# Patient Record
Sex: Female | Born: 1955 | Race: White | Hispanic: No | Marital: Married | Smoking: Never smoker
Health system: Southern US, Community
[De-identification: ages and names within clinical notes are randomized; demographics above are authoritative.]

## PROBLEM LIST (undated history)

## (undated) DIAGNOSIS — R011 Cardiac murmur, unspecified: Secondary | ICD-10-CM

## (undated) DIAGNOSIS — T7840XA Allergy, unspecified, initial encounter: Secondary | ICD-10-CM

## (undated) DIAGNOSIS — R51 Headache: Secondary | ICD-10-CM

## (undated) DIAGNOSIS — I341 Nonrheumatic mitral (valve) prolapse: Secondary | ICD-10-CM

## (undated) DIAGNOSIS — R519 Headache, unspecified: Secondary | ICD-10-CM

## (undated) DIAGNOSIS — C4491 Basal cell carcinoma of skin, unspecified: Secondary | ICD-10-CM

## (undated) DIAGNOSIS — F419 Anxiety disorder, unspecified: Secondary | ICD-10-CM

## (undated) DIAGNOSIS — J189 Pneumonia, unspecified organism: Secondary | ICD-10-CM

## (undated) DIAGNOSIS — D649 Anemia, unspecified: Secondary | ICD-10-CM

## (undated) DIAGNOSIS — G4733 Obstructive sleep apnea (adult) (pediatric): Secondary | ICD-10-CM

## (undated) DIAGNOSIS — Z9989 Dependence on other enabling machines and devices: Secondary | ICD-10-CM

## (undated) DIAGNOSIS — E785 Hyperlipidemia, unspecified: Secondary | ICD-10-CM

## (undated) DIAGNOSIS — K219 Gastro-esophageal reflux disease without esophagitis: Secondary | ICD-10-CM

## (undated) DIAGNOSIS — J302 Other seasonal allergic rhinitis: Secondary | ICD-10-CM

## (undated) HISTORY — DX: Anxiety disorder, unspecified: F41.9

## (undated) HISTORY — PX: BASAL CELL CARCINOMA EXCISION: SHX1214

## (undated) HISTORY — DX: Cardiac murmur, unspecified: R01.1

## (undated) HISTORY — DX: Hyperlipidemia, unspecified: E78.5

## (undated) HISTORY — DX: Allergy, unspecified, initial encounter: T78.40XA

## (undated) HISTORY — PX: ANTERIOR CERVICAL DECOMP/DISCECTOMY FUSION: SHX1161

## (undated) HISTORY — PX: BACK SURGERY: SHX140

---

## 1995-08-24 HISTORY — PX: ARTHROSCOPIC REPAIR ACL: SUR80

## 1998-11-13 ENCOUNTER — Other Ambulatory Visit: Admission: RE | Admit: 1998-11-13 | Discharge: 1998-11-13 | Payer: Self-pay | Admitting: Gynecology

## 1999-05-06 ENCOUNTER — Other Ambulatory Visit: Admission: RE | Admit: 1999-05-06 | Discharge: 1999-05-06 | Payer: Self-pay | Admitting: Gynecology

## 1999-09-15 ENCOUNTER — Other Ambulatory Visit: Admission: RE | Admit: 1999-09-15 | Discharge: 1999-09-15 | Payer: Self-pay | Admitting: Gynecology

## 2000-05-19 ENCOUNTER — Other Ambulatory Visit: Admission: RE | Admit: 2000-05-19 | Discharge: 2000-05-19 | Payer: Self-pay | Admitting: Gynecology

## 2001-09-12 ENCOUNTER — Other Ambulatory Visit: Admission: RE | Admit: 2001-09-12 | Discharge: 2001-09-12 | Payer: Self-pay | Admitting: Gynecology

## 2003-01-23 ENCOUNTER — Other Ambulatory Visit: Admission: RE | Admit: 2003-01-23 | Discharge: 2003-01-23 | Payer: Self-pay | Admitting: Obstetrics and Gynecology

## 2004-03-18 ENCOUNTER — Emergency Department (HOSPITAL_COMMUNITY): Admission: EM | Admit: 2004-03-18 | Discharge: 2004-03-18 | Payer: Self-pay | Admitting: Emergency Medicine

## 2004-04-03 ENCOUNTER — Other Ambulatory Visit: Admission: RE | Admit: 2004-04-03 | Discharge: 2004-04-03 | Payer: Self-pay | Admitting: Obstetrics and Gynecology

## 2004-06-25 ENCOUNTER — Ambulatory Visit: Payer: Self-pay | Admitting: Family Medicine

## 2004-07-02 ENCOUNTER — Ambulatory Visit: Payer: Self-pay | Admitting: Family Medicine

## 2004-07-15 ENCOUNTER — Ambulatory Visit: Payer: Self-pay | Admitting: Family Medicine

## 2004-10-08 ENCOUNTER — Ambulatory Visit: Payer: Self-pay | Admitting: Family Medicine

## 2004-11-27 ENCOUNTER — Ambulatory Visit: Payer: Self-pay | Admitting: Family Medicine

## 2005-07-01 ENCOUNTER — Ambulatory Visit: Payer: Self-pay | Admitting: Family Medicine

## 2005-07-09 ENCOUNTER — Other Ambulatory Visit: Admission: RE | Admit: 2005-07-09 | Discharge: 2005-07-09 | Payer: Self-pay | Admitting: Family Medicine

## 2005-07-09 ENCOUNTER — Ambulatory Visit: Payer: Self-pay | Admitting: Family Medicine

## 2005-11-19 ENCOUNTER — Ambulatory Visit: Payer: Self-pay | Admitting: Family Medicine

## 2006-07-12 ENCOUNTER — Ambulatory Visit: Payer: Self-pay | Admitting: Family Medicine

## 2006-09-15 ENCOUNTER — Ambulatory Visit: Payer: Self-pay | Admitting: Family Medicine

## 2006-09-15 LAB — CONVERTED CEMR LAB
ALT: 14 units/L (ref 0–40)
AST: 21 units/L (ref 0–37)
HDL: 59.1 mg/dL (ref >39.0)

## 2009-04-17 ENCOUNTER — Encounter: Admission: RE | Admit: 2009-04-17 | Discharge: 2009-04-17 | Payer: Self-pay | Admitting: Internal Medicine

## 2009-04-19 ENCOUNTER — Encounter: Admission: RE | Admit: 2009-04-19 | Discharge: 2009-04-19 | Payer: Self-pay | Admitting: Internal Medicine

## 2009-07-24 ENCOUNTER — Ambulatory Visit (HOSPITAL_BASED_OUTPATIENT_CLINIC_OR_DEPARTMENT_OTHER): Admission: RE | Admit: 2009-07-24 | Discharge: 2009-07-24 | Payer: Self-pay | Admitting: Nurse Practitioner

## 2009-08-02 ENCOUNTER — Ambulatory Visit: Payer: Self-pay | Admitting: Internal Medicine

## 2009-08-18 ENCOUNTER — Ambulatory Visit (HOSPITAL_COMMUNITY): Admission: RE | Admit: 2009-08-18 | Discharge: 2009-08-19 | Payer: Self-pay | Admitting: Neurological Surgery

## 2009-09-23 ENCOUNTER — Encounter: Admission: RE | Admit: 2009-09-23 | Discharge: 2009-09-23 | Payer: Self-pay | Admitting: Neurological Surgery

## 2009-11-14 ENCOUNTER — Encounter: Admission: RE | Admit: 2009-11-14 | Discharge: 2009-11-14 | Payer: Self-pay | Admitting: Neurological Surgery

## 2010-09-04 ENCOUNTER — Encounter
Admission: RE | Admit: 2010-09-04 | Discharge: 2010-09-04 | Payer: Self-pay | Source: Home / Self Care | Attending: Neurological Surgery | Admitting: Neurological Surgery

## 2010-11-23 LAB — DIFFERENTIAL
Basophils Relative: 0 % (ref 0–1)
Lymphs Abs: 1.4 10*3/uL (ref 0.7–4.0)
Monocytes Relative: 6 % (ref 3–12)

## 2010-11-23 LAB — PROTIME-INR
INR: 0.94 (ref 0.00–1.49)
Prothrombin Time: 12.5 seconds (ref 11.6–15.2)

## 2010-11-23 LAB — BASIC METABOLIC PANEL
BUN: 18 mg/dL (ref 6–23)
CO2: 28 mEq/L (ref 19–32)
Chloride: 103 mEq/L (ref 96–112)
Sodium: 137 mEq/L (ref 135–145)

## 2010-11-23 LAB — CBC
HCT: 40.4 % (ref 36.0–46.0)
Hemoglobin: 13.8 g/dL (ref 12.0–15.0)
Platelets: 226 10*3/uL (ref 150–400)
RDW: 13.1 % (ref 11.5–15.5)

## 2010-11-23 LAB — APTT: aPTT: 28 seconds (ref 24–37)

## 2012-09-11 ENCOUNTER — Ambulatory Visit: Payer: Self-pay | Admitting: Internal Medicine

## 2013-09-13 ENCOUNTER — Emergency Department (HOSPITAL_COMMUNITY): Payer: BC Managed Care – PPO

## 2013-09-13 ENCOUNTER — Encounter (HOSPITAL_COMMUNITY): Payer: Self-pay | Admitting: Emergency Medicine

## 2013-09-13 ENCOUNTER — Emergency Department (HOSPITAL_COMMUNITY)
Admission: EM | Admit: 2013-09-13 | Discharge: 2013-09-13 | Disposition: A | Discharge status: Home / Self Care | Payer: BC Managed Care – PPO | Attending: Emergency Medicine | Admitting: Emergency Medicine

## 2013-09-13 ENCOUNTER — Emergency Department (INDEPENDENT_AMBULATORY_CARE_PROVIDER_SITE_OTHER)
Admission: EM | Admit: 2013-09-13 | Discharge: 2013-09-13 | Disposition: A | Discharge status: Hospice | Payer: BC Managed Care – PPO | Source: Home / Self Care | Attending: Emergency Medicine | Admitting: Emergency Medicine

## 2013-09-13 DIAGNOSIS — R51 Headache: Secondary | ICD-10-CM | POA: Insufficient documentation

## 2013-09-13 DIAGNOSIS — R0789 Other chest pain: Secondary | ICD-10-CM | POA: Diagnosis not present

## 2013-09-13 DIAGNOSIS — R5383 Other fatigue: Secondary | ICD-10-CM | POA: Diagnosis not present

## 2013-09-13 DIAGNOSIS — R0602 Shortness of breath: Secondary | ICD-10-CM | POA: Insufficient documentation

## 2013-09-13 DIAGNOSIS — R5381 Other malaise: Secondary | ICD-10-CM | POA: Insufficient documentation

## 2013-09-13 DIAGNOSIS — R209 Unspecified disturbances of skin sensation: Secondary | ICD-10-CM | POA: Insufficient documentation

## 2013-09-13 DIAGNOSIS — R42 Dizziness and giddiness: Secondary | ICD-10-CM | POA: Insufficient documentation

## 2013-09-13 DIAGNOSIS — R079 Chest pain, unspecified: Secondary | ICD-10-CM | POA: Diagnosis present

## 2013-09-13 LAB — BASIC METABOLIC PANEL
BUN: 14 mg/dL (ref 6–23)
CHLORIDE: 105 meq/L (ref 96–112)
CO2: 24 meq/L (ref 19–32)
Calcium: 9 mg/dL (ref 8.4–10.5)
Creatinine, Ser: 0.77 mg/dL (ref 0.50–1.10)
GFR calc Af Amer: >90 mL/min (ref >90)
GLUCOSE: 107 mg/dL — AB (ref 70–99)
POTASSIUM: 4.4 meq/L (ref 3.7–5.3)
Sodium: 141 mEq/L (ref 137–147)

## 2013-09-13 LAB — CBC WITH DIFFERENTIAL/PLATELET
BASOS ABS: 0 10*3/uL (ref 0.0–0.1)
BASOS PCT: 0 % (ref 0–1)
EOS ABS: 0.1 10*3/uL (ref 0.0–0.7)
Eosinophils Relative: 3 % (ref 0–5)
HEMATOCRIT: 39.1 % (ref 36.0–46.0)
HEMOGLOBIN: 13.1 g/dL (ref 12.0–15.0)
LYMPHS ABS: 1.5 10*3/uL (ref 0.7–4.0)
Lymphocytes Relative: 27 % (ref 12–46)
MCH: 30.8 pg (ref 26.0–34.0)
MCHC: 33.5 g/dL (ref 30.0–36.0)
MCV: 91.8 fL (ref 78.0–100.0)
Monocytes Absolute: 0.4 10*3/uL (ref 0.1–1.0)
Monocytes Relative: 6 % (ref 3–12)
Neutro Abs: 3.5 10*3/uL (ref 1.7–7.7)
Neutrophils Relative %: 64 % (ref 43–77)
PLATELETS: 192 10*3/uL (ref 150–400)
RBC: 4.26 MIL/uL (ref 3.87–5.11)
RDW: 13.2 % (ref 11.5–15.5)
WBC: 5.5 10*3/uL (ref 4.0–10.5)

## 2013-09-13 LAB — POCT I-STAT TROPONIN I
Troponin i, poc: 0 ng/mL (ref 0.00–0.08)
Troponin i, poc: 0 ng/mL (ref 0.00–0.08)

## 2013-09-13 MED ORDER — SODIUM CHLORIDE 0.9 % IV SOLN
INTRAVENOUS | Status: DC
  Administered 2013-09-13: 09:00:00 via INTRAVENOUS

## 2013-09-13 MED ORDER — ASPIRIN 81 MG PO CHEW
324.0000 mg | CHEWABLE_TABLET | Freq: Once | ORAL | Status: AC
  Administered 2013-09-13: 324 mg via ORAL

## 2013-09-13 MED ORDER — ASPIRIN 81 MG PO CHEW
CHEWABLE_TABLET | ORAL | Status: AC
  Filled 2013-09-13: qty 4

## 2013-09-13 NOTE — ED Notes (Addendum)
Patient presents to ED via [facility] from urgent care with complaints of chest tightness, shortness of breath,  and heavyness, tingling in both hands, nausea, headache, and left shoulder pain lastnight.

## 2013-09-13 NOTE — Discharge Instructions (Signed)
Continue taking all medications as directed. Follow-up with your primary care physician for re-check and to discuss this ED visit. Return to the ED for new or worsening symptoms.

## 2013-09-13 NOTE — ED Notes (Signed)
Notified [facility] 

## 2013-09-13 NOTE — ED Notes (Signed)
Patient discharged to home with family. NAD.  

## 2013-09-13 NOTE — Discharge Instructions (Signed)
We have determined that your problem requires further evaluation in the emergency department.  We will take care of your transport there.  Once at the emergency department, you will be evaluated by a provider and they will order whatever treatment or tests they deem necessary.  We cannot guarantee that they will do any specific test or do any specific treatment.  ° °

## 2013-09-13 NOTE — ED Notes (Signed)
C/o chest tightness, weak feeling, and lightheadedness States she took oxycodone last night around 10:15 and that's when the sx started States she don't take oxycodone medication on the regular States she did feel jumpy, chest and neck was itchy

## 2013-09-13 NOTE — ED Provider Notes (Signed)
Medical screening examination/treatment/procedure(s) were conducted as a shared visit with non-physician practitioner(s) and myself.  I personally evaluated the patient during the encounter.  EKG Interpretation   None       I interviewed and examined the patient. Lungs are CTAB. Cardiac exam wnl. Abdomen soft.  Atypical for cardiac presentation and pt currently asx. Delta trop neg. Low risk for MACE per HEART score. Will rec close f/u w/ pcp.   Blanchard Kelch, MD 09/13/13 [phone]

## 2013-09-13 NOTE — ED Provider Notes (Signed)
CSN: [id number]     Arrival date & time 09/13/13  9485 History   First MD Initiated Contact with Patient 09/13/13 [phone]     Chief Complaint  Patient presents with  . Chest Pain   (Consider location/radiation/quality/duration/timing/severity/associated sxs/prior Treatment) The history is provided by the patient and medical records.   This is a 64 roll female with past history significant for mitral valve prolapse, presenting to the ED from urgent care for further evaluation of chest pain, onset this morning at 0200. Patient states she was lying awake in bed and began feeling a heavy sensation on her chest associated with paresthesias of bilateral upper extremities, dizziness, generalized weakness, and mild shortness of breath. She admitted to some nausea but denies vomiting. She denies any diaphoresis or palpitations.  Pt states she does recall taking one of her husbands pain pills (oxycodone) prior to going to bed for some left shoulder pain-- states she does not normally do this.  She notes an intermittent, nonproductive cough due to sinus congestion but denies fevers, body aches, sore throat, or ear pain.  Patient does have an extensive family history of heart disease and prior MI.  No recent lower extremity edema, calf pain, recent surgeries, or long distance travel. No prior history of DVT or PE.  No smoking hx.  Pt given ASA at urgent care with relief of sx.  VS stable on arrival. PCP-- Delia Chimes  History reviewed. No pertinent past medical history. History reviewed. No pertinent past surgical history. History reviewed. No pertinent family history. History  Substance Use Topics  . Smoking status: Never Smoker   . Smokeless tobacco: Not on file  . Alcohol Use: Not on file   OB History   Grav Para Term Preterm Abortions TAB SAB Ect Mult Living                 Review of Systems  Respiratory: Positive for shortness of breath.   Cardiovascular: Positive for chest pain.  Neurological:  Positive for headaches.  All other systems reviewed and are negative.    Allergies  Review of patient's allergies indicates no known allergies.  Home Medications  No current outpatient prescriptions on file. BP 129/80  Pulse 64  Temp(Src) 97.7 F (36.5 C) (Oral)  Resp 18  SpO2 100%  Physical Exam  Nursing note and vitals reviewed. Constitutional: She is oriented to person, place, and time. She appears well-developed and well-nourished. No distress.  HENT:  Head: Normocephalic and atraumatic.  Mouth/Throat: Oropharynx is clear and moist.  Eyes: Conjunctivae and EOM are normal. Pupils are equal, round, and reactive to light.  Neck: Normal range of motion. Neck supple.  Cardiovascular: Normal rate, regular rhythm and normal heart sounds.   Pulmonary/Chest: Effort normal and breath sounds normal. No respiratory distress. She has no wheezes.  Abdominal: Soft. Bowel sounds are normal. There is no tenderness. There is no guarding.  Musculoskeletal: Normal range of motion.  Neurological: She is alert and oriented to person, place, and time. She has normal strength. She displays no tremor. No cranial nerve deficit or sensory deficit. She displays no seizure activity.  AAOx3, answering questions appropriately; equal strength UE and LE bilaterally; CN grossly intact; moves all extremities appropriately without ataxia; no focal neuro deficits or facial asymmetry appreciated  Skin: Skin is warm and dry. She is not diaphoretic.  Psychiatric: She has a normal mood and affect.    ED Course  Procedures (including critical care time)   Date: 09/13/2013  Rate: 62  Rhythm: normal sinus rhythm  QRS Axis: normal  Intervals: normal  ST/T Wave abnormalities: normal  Conduction Disutrbances:none  Narrative Interpretation:   Old EKG Reviewed: none available  Labs Review Labs Reviewed  BASIC METABOLIC PANEL - Abnormal; Notable for the following:    Glucose, Bld 107 (*)    All other  components within normal limits  CBC WITH DIFFERENTIAL  POCT I-STAT TROPONIN I  POCT I-STAT TROPONIN I   Imaging Review Dg Chest 2 View  09/13/2013   CLINICAL DATA:  Chest pain  EXAM: CHEST  2 VIEW  COMPARISON:  August 14, 2009  FINDINGS: There is underlying emphysematous change. There is no edema or consolidation. The heart size is normal. Pulmonary vascularity reflects underlying emphysema. No adenopathy. There is postoperative change in the lower cervical spine.  IMPRESSION: Underlying emphysematous change.  No edema or consolidation.   Electronically Signed   By: Lowella Grip M.D.   On: 09/13/2013 10:26    EKG Interpretation   None       MDM   1. Chest pain    EKG normal sinus rhythm, no acute ischemic changes. Troponin negative. Chest x-ray is clear.  Labs as above.  Patient's only known risk factor is positive family history for CAD and MI, heart score is a 3.  Her pain has resolved in the ED, however story is concerning.  Will obtain 3 hour delta troponin.  Delta troponin is negative.  Pt has remained chest pain free while in the ED.  At this time i have low suspicion for ACS, PE, dissection, or other acute cardiac event at this time.  Pt will FU with her PCP within 1 week for re-check and to discuss this ED visit.  Discussed plan with pt and husband at beside, they acknowledged understanding and agrees with plan of care.  Strict return precautions advised for new or worsening symptoms.  Discussed with Dr. Aline Brochure who has personally evaluated pt and agrees with assessment and plan of care.  Larene Pickett, PA-C 09/13/13 1455

## 2013-09-13 NOTE — ED Provider Notes (Signed)
  Chief Complaint   Chief Complaint  Patient presents with  . Dizziness  . Fatigue  . Chest Pain    History of Present Illness    Cheyenne Hanson is a 58 year old female who presents with a history since 2 AM this morning of chest tightness in her upper chest, feeling lightheaded, numb and tingly, and short of breath. She's felt nauseated and slightly cold. She denies any sweats. The patient notes that she had some left shoulder pain last night upon going to bed. She took one of her husbands oxycodone 15 mg tablets at around 10:15. She does not usually take oxycodone. She feels dizzy, lightheaded, and weak. She denies any fever, coughing, wheezing, palpitations, syncope, or abdominal pain. She has had no vomiting. She denies any history of ischemic heart disease. She does have a history of mitral valve prolapse. And she has no history of diabetes, hypertension, or hypercholesterolemia. There is a positive family history of heart disease.  Review of Systems    Other than noted above, the patient denies any of the following symptoms. Systemic:  No fever, chills, sweats, or fatigue. ENT:  No nasal congestion, rhinorrhea, or sore throat. Pulmonary:  No cough, wheezing, shortness of breath, sputum production, hemoptysis. Cardiac:  No palpitations, rapid heartbeat, dizziness, presyncope or syncope. GI:  No abdominal pain, heartburn, nausea, or vomiting. Ext:  No leg pain or swelling.  [place]    Past medical history, family history, social history, meds, and allergies were reviewed and updated as needed.  Physical Exam     Vital signs:  BP 138/77  Pulse 66  Temp(Src) 98 F (36.7 C) (Oral)  Resp 18  SpO2 100% Gen:  Alert, oriented, in no distress, skin warm and dry. Eye:  PERRL, lids and conjunctivas normal.  Sclera non-icteric. ENT:  Mucous membranes moist, pharynx clear. Neck:  Supple, no adenopathy or tenderness.  No JVD. Lungs:  Clear to auscultation, no wheezes, rales or rhonchi.   No respiratory distress. Heart:  Regular rhythm.  No gallops, murmers, clicks or rubs. Chest:  No chest wall tenderness. Abdomen:  Soft, nontender, no organomegaly or mass.  Bowel sounds normal.  No pulsatile abdominal mass or bruit. Ext:  No edema.  No calf tenderness and Homann's sign negative.  Pulses full and equal. Skin:  Warm and dry.  No rash.   Electrocardiogram     Date: 09/13/2013  Rate: 62  Rhythm: normal sinus rhythm  QRS Axis: normal  Intervals: normal  ST/T Wave abnormalities: normal  Conduction Disutrbances:none  Narrative Interpretation: Normal sinus rhythm, normal EKG.  Old EKG Reviewed: none available  Course in [facility]   She was begun on normal saline IV at 50 mL per hour and given aspirin 325 mg by mouth. I did not elect to give her nitroglycerin and she has a migraine type headache right now.  Assessment     The encounter diagnosis was Chest discomfort.  Plan     The patient was transferred to the ED via [place] in stable condition.  Medical Decision Making     58  Year old female with positive FH presents with history of chest tightness, shortness of breath, nausea since 2 a.m. Today.  EKG was normal.  No cardiac history. We will start IV NS, give ASA.  No TNG since she has a migraine type headache.  Transport via [place].    Harden Mo, MD 09/13/13 [phone]

## 2016-12-17 ENCOUNTER — Other Ambulatory Visit: Payer: Self-pay | Admitting: Family Medicine

## 2016-12-17 ENCOUNTER — Ambulatory Visit (INDEPENDENT_AMBULATORY_CARE_PROVIDER_SITE_OTHER): Payer: BLUE CROSS/BLUE SHIELD | Admitting: Family Medicine

## 2016-12-17 ENCOUNTER — Encounter: Payer: Self-pay | Admitting: Family Medicine

## 2016-12-17 VITALS — BP 128/74 | HR 78 | Temp 97.8°F | Resp 14 | Ht 69.0 in | Wt 175.0 lb

## 2016-12-17 DIAGNOSIS — Z1231 Encounter for screening mammogram for malignant neoplasm of breast: Secondary | ICD-10-CM | POA: Diagnosis not present

## 2016-12-17 DIAGNOSIS — Z0000 Encounter for general adult medical examination without abnormal findings: Secondary | ICD-10-CM

## 2016-12-17 DIAGNOSIS — E7800 Pure hypercholesterolemia, unspecified: Secondary | ICD-10-CM | POA: Diagnosis not present

## 2016-12-17 DIAGNOSIS — Z23 Encounter for immunization: Secondary | ICD-10-CM

## 2016-12-17 DIAGNOSIS — G4733 Obstructive sleep apnea (adult) (pediatric): Secondary | ICD-10-CM | POA: Diagnosis not present

## 2016-12-17 DIAGNOSIS — Z1239 Encounter for other screening for malignant neoplasm of breast: Secondary | ICD-10-CM

## 2016-12-17 LAB — COMPLETE METABOLIC PANEL WITH GFR
AG Ratio: 1.7 Ratio (ref 1.0–2.5)
ALBUMIN: 4.3 g/dL (ref 3.6–5.1)
ALT: 18 U/L (ref 6–29)
AST: 19 U/L (ref 10–35)
Alkaline Phosphatase: 42 U/L (ref 33–130)
BUN/Creatinine Ratio: 19 Ratio (ref 6–22)
BUN: 16 mg/dL (ref 7–25)
CO2: 22 mmol/L (ref 20–31)
Calcium: 9.8 mg/dL (ref 8.6–10.4)
Chloride: 106 mmol/L (ref 98–110)
Creat: 0.84 mg/dL (ref 0.50–1.05)
GFR, EST NON AFRICAN AMERICAN: 76 mL/min (ref >=60)
GFR, Est African American: 88 mL/min (ref >=60)
Globulin: 2.6 g/dL (ref 1.9–3.7)
Glucose, Bld: 112 mg/dL — ABNORMAL HIGH (ref 70–99)
Potassium: 4.8 mmol/L (ref 3.5–5.3)
Sodium: 141 mmol/L (ref 135–146)
Total Bilirubin: 0.4 mg/dL (ref 0.2–1.2)
Total Protein: 6.9 g/dL (ref 6.1–8.1)

## 2016-12-17 LAB — CBC WITH DIFFERENTIAL/PLATELET
BASOS ABS: 0 {cells}/uL (ref 0–200)
BASOS PCT: 0 %
EOS ABS: 201 {cells}/uL (ref 15–500)
Eosinophils Relative: 3 %
HEMATOCRIT: 41 % (ref 35.0–45.0)
HEMOGLOBIN: 13.4 g/dL (ref 12.0–15.0)
LYMPHS ABS: 1809 {cells}/uL (ref 850–3900)
Lymphocytes Relative: 27 %
MCH: 30.3 pg (ref 27.0–33.0)
MCHC: 32.7 g/dL (ref 32.0–36.0)
MCV: 92.8 fL (ref 80.0–100.0)
MONO ABS: 402 {cells}/uL (ref 200–950)
MONOS PCT: 6 %
MPV: 9.1 fL (ref 7.5–12.5)
NEUTROS ABS: 4288 {cells}/uL (ref 1500–7800)
Neutrophils Relative %: 64 %
PLATELETS: 258 10*3/uL (ref 140–400)
RBC: 4.42 MIL/uL (ref 3.80–5.10)
RDW: 14 % (ref 11.0–15.0)
WBC: 6.7 10*3/uL (ref 3.8–10.8)

## 2016-12-17 LAB — LIPID PANEL
Cholesterol: 202 mg/dL — ABNORMAL HIGH (ref <200)
HDL: 56 mg/dL (ref >50)
LDL Cholesterol: 123 mg/dL — ABNORMAL HIGH (ref <100)
Total CHOL/HDL Ratio: 3.6 Ratio (ref <5.0)
Triglycerides: 114 mg/dL (ref <150)
VLDL: 23 mg/dL (ref <30)

## 2016-12-17 NOTE — Progress Notes (Signed)
Subjective:    Patient ID: Cheyenne Hanson, female    DOB: 08-01-1956, 61 y.o.   MRN: [id number]  HPI  Patient is a very pleasant 61 year old white female here today to establish care. Past medical history significant for obstructive sleep apnea on CPAP since 2010. At that time showed a sleep study that showed an apnea hypotony index of approximately 16. She is on AutoPap. She also has a history of an ACDF in the cervical spine performed by Dr. Ronnald Ramp in approximately 2010. 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 She is due for the shingles vaccine, tetanus vaccine, a mammogram. Her Pap smear is up-to-date. She sees a gynecologist for this. She does request a new CPAP machine as hers is now almost 60 years old and is wearing out. Past Medical History:  Diagnosis Date  . Allergy   . Anxiety    uses klonopin sparingly (30 lasts a year)  . Hyperlipidemia   . Sleep apnea    cpap since 2008   Past Surgical History:  Procedure Laterality Date  . ARTHROSCOPIC REPAIR ACL    . SPINE SURGERY     fusion cervical spine Dr. Ronnald Ramp   Current Outpatient Prescriptions on File Prior to Visit  Medication Sig Dispense Refill  . ibuprofen (ADVIL,MOTRIN) 200 MG tablet Take 600 mg by mouth every 6 (six) hours as needed for headache or moderate pain.     No current facility-administered medications on file prior to visit.    Allergies  Allergen Reactions  . Latex Other (See Comments)    Blisters if she wears the gloves  . Statins     Muscle aches   Social History   Social History  . Marital status: Married    Spouse name: N/A  . Number of children: N/A  . Years of education: N/A   Occupational History  . Not on file.   Social History Main Topics  . Smoking status: Never Smoker  . Smokeless tobacco: Never Used  . Alcohol use Not on file  . Drug use: Unknown  . Sexual activity: Not on file   Other Topics Concern   . Not on file   Social History Narrative  . No narrative on file   Family History  Problem Relation Age of Onset  . Cancer Father     lung  . Stroke Sister   . Hyperlipidemia Sister   . Heart disease Brother   . Hyperlipidemia Brother    Dad also committed suicide  Review of Systems  All other systems reviewed and are negative.      Objective:   Physical Exam  Constitutional: She is oriented to person, place, and time. She appears well-developed and well-nourished. No distress.  HENT:  Head: Normocephalic and atraumatic.  Right Ear: External ear normal.  Left Ear: External ear normal.  Nose: Nose normal.  Mouth/Throat: Oropharynx is clear and moist. No oropharyngeal exudate.  Eyes: Conjunctivae and EOM are normal. Pupils are equal, round, and reactive to light. Right eye exhibits no discharge. Left eye exhibits no discharge. No scleral icterus.  Neck: Normal range of motion. Neck supple. No JVD present. No tracheal deviation present. No thyromegaly present.  Cardiovascular: Normal rate, regular rhythm, normal heart sounds and intact distal pulses.  Exam reveals no gallop and no friction rub.   No murmur heard. Pulmonary/Chest: Effort normal and breath sounds normal. No stridor. No respiratory distress.  She has no wheezes. She has no rales. She exhibits no tenderness.  Abdominal: Soft. Bowel sounds are normal. She exhibits no distension and no mass. There is no tenderness. There is no rebound and no guarding.  Musculoskeletal: Normal range of motion. She exhibits no edema, tenderness or deformity.  Lymphadenopathy:    She has no cervical adenopathy.  Neurological: She is alert and oriented to person, place, and time. She has normal reflexes. She displays normal reflexes. No cranial nerve deficit. She exhibits normal muscle tone. Coordination normal.  Skin: Skin is warm. No rash noted. She is not diaphoretic. No erythema. No pallor.  Psychiatric: She has a normal mood and  affect. Her behavior is normal. Judgment and thought content normal.  Vitals reviewed.         Assessment & Plan:  General medical exam - Plan: CBC with Differential/Platelet, COMPLETE METABOLIC PANEL WITH GFR, Lipid panel, TSH  Obstructive sleep apnea  Pure hypercholesterolemia  Screening breast examination - Plan: MM Digital Screening  His exam today is completely normal. I will check a CBC, CMP, fasting lipid panel, and TSH. Goal LDL cholesterol is less than 130. I will schedule the patient for mammogram area at her colonoscopy and Pap smear up-to-date. She will receive the tetanus shot today. We discussed the shingles vaccine but she declined the present time. We also discussed HIV and hepatitis C screening but she declined due to lack of risk factors.

## 2016-12-17 NOTE — Addendum Note (Signed)
Addended by: Shary Decamp B on: 12/17/2016 09:33 AM   Modules accepted: Orders

## 2016-12-18 LAB — TSH: TSH: 1.7 m[IU]/L

## 2016-12-20 ENCOUNTER — Encounter: Payer: Self-pay | Admitting: Family Medicine

## 2017-01-11 ENCOUNTER — Ambulatory Visit: Payer: Self-pay

## 2017-01-21 ENCOUNTER — Ambulatory Visit: Payer: Self-pay

## 2017-01-28 ENCOUNTER — Ambulatory Visit
Admission: RE | Admit: 2017-01-28 | Discharge: 2017-01-28 | Disposition: A | Discharge status: Home / Self Care | Payer: BLUE CROSS/BLUE SHIELD | Source: Ambulatory Visit | Attending: Family Medicine | Admitting: Family Medicine

## 2017-01-28 DIAGNOSIS — Z1231 Encounter for screening mammogram for malignant neoplasm of breast: Secondary | ICD-10-CM

## 2017-07-13 ENCOUNTER — Telehealth: Payer: Self-pay | Admitting: Family Medicine

## 2017-07-13 ENCOUNTER — Encounter (HOSPITAL_COMMUNITY): Payer: Self-pay | Admitting: Emergency Medicine

## 2017-07-13 ENCOUNTER — Other Ambulatory Visit: Payer: Self-pay

## 2017-07-13 ENCOUNTER — Ambulatory Visit (HOSPITAL_COMMUNITY)
Admission: EM | Admit: 2017-07-13 | Discharge: 2017-07-13 | Disposition: A | Discharge status: Home / Self Care | Payer: BLUE CROSS/BLUE SHIELD | Attending: Family Medicine | Admitting: Family Medicine

## 2017-07-13 ENCOUNTER — Ambulatory Visit (INDEPENDENT_AMBULATORY_CARE_PROVIDER_SITE_OTHER): Payer: BLUE CROSS/BLUE SHIELD

## 2017-07-13 DIAGNOSIS — J181 Lobar pneumonia, unspecified organism: Secondary | ICD-10-CM | POA: Diagnosis not present

## 2017-07-13 DIAGNOSIS — J189 Pneumonia, unspecified organism: Secondary | ICD-10-CM

## 2017-07-13 MED ORDER — AZITHROMYCIN 250 MG PO TABS: 250.0000 mg | ORAL_TABLET | Freq: Every day | ORAL | 0 refills | Status: DC

## 2017-07-13 MED ORDER — AZELASTINE HCL 0.1 % NA SOLN: 2.0000 | Freq: Two times a day (BID) | NASAL | 0 refills | Status: DC

## 2017-07-13 NOTE — Telephone Encounter (Signed)
Monday started with congestion.  Has on and off fever since then.  Now has occ stabbing pain on left side of chest wall with deep breath.  Has been taking Mucinex for her congestion.  Unsure what could be cause.  Told no appt available here for the rest of today.  Should seek care at an Urgent Care.  She states she will see how she feels in the morning, if not better or worse will seek treatment.

## 2017-07-13 NOTE — ED Provider Notes (Signed)
[facility]    CSN: [id number] Arrival date & time: 07/13/17  1616     History   Chief Complaint Chief Complaint  Patient presents with  . Flank Pain  . Nasal Congestion  . Fever    HPI Cheyenne Hanson is a 61 y.o. female.   61 year old female with history of sleep apnea, HLD, anxiety comes in for 2 day history of congestion, post nasal drip, fevers. Tmax 99.2. Minimal cough that is nonproductive. Also experiencing left rib pain when she takes deep breath with tenderness to palpation. Denies chest pain, shortness of breath, wheezing, weakness, dizziness. Denies abdominal pain, nausea, vomiting. Denies urinary symptoms such as frequency, dysuria, hematuria. Denies personal or family history of blood clots. No long travels, unilateral leg swelling.       Past Medical History:  Diagnosis Date  . Allergy   . Anxiety    uses klonopin sparingly (30 lasts a year)  . Hyperlipidemia   . Sleep apnea    cpap since 2008    There are no active problems to display for this patient.   Past Surgical History:  Procedure Laterality Date  . ARTHROSCOPIC REPAIR ACL    . SPINE SURGERY     fusion cervical spine Dr. Ronnald Ramp    OB History    No data available       Home Medications    Prior to Admission medications   Medication Sig Start Date End Date Taking? Authorizing Provider  clonazePAM (KLONOPIN) 0.5 MG tablet Take 0.5 mg by mouth at bedtime.  11/25/16  Yes [provider]  ezetimibe (ZETIA) 10 MG tablet Take 10 mg by mouth daily.   Yes [provider]  guaiFENesin (MUCINEX) 600 MG 12 hr tablet Take by mouth 2 (two) times daily.   Yes [provider]  ibuprofen (ADVIL,MOTRIN) 200 MG tablet Take 600 mg by mouth every 6 (six) hours as needed for headache or moderate pain.   Yes [provider]  loratadine (CLARITIN) 10 MG tablet Take 10 mg by mouth daily.   Yes [provider]  valACYclovir (VALTREX) 1000 MG tablet Take  1,000 mg by mouth 2 (two) times daily. Prn Fever blisters   Yes [provider]  azelastine (ASTELIN) 0.1 % nasal spray Place 2 sprays into both nostrils 2 (two) times daily. Use in each nostril as directed 07/13/17 10/11/17  Tasia Catchings, Madelline Eshbach V, PA-C  azithromycin (ZITHROMAX) 250 MG tablet Take 1 tablet (250 mg total) by mouth daily. Take first 2 tablets together, then 1 every day until finished. 07/13/17   Ok Edwards, PA-C    Family History Family History  Problem Relation Age of Onset  . Cancer Father        lung  . Stroke Sister   . Hyperlipidemia Sister   . Heart disease Brother   . Hyperlipidemia Brother   . Breast cancer Cousin     Social History Social History   Tobacco Use  . Smoking status: Never Smoker  . Smokeless tobacco: Never Used  Substance Use Topics  . Alcohol use: Not on file  . Drug use: Not on file     Allergies   Latex and Statins   Review of Systems Review of Systems  Reason unable to perform ROS: See HPI as above.     Physical Exam Triage Vital Signs ED Triage Vitals  Enc Vitals Group     BP 07/13/17 1648 (!) 145/75  Pulse Rate 07/13/17 1648 (!) 109     Resp 07/13/17 1648 20     Temp 07/13/17 1648 100.2 F (37.9 C)     Temp src --      SpO2 07/13/17 1648 99 %     Weight --      Height --      Head Circumference --      Peak Flow --      Pain Score 07/13/17 1651 2     Pain Loc --      Pain Edu? --      Excl. in [place]? --    No data found.  Updated Vital Signs BP 135/76 (BP Location: Left Arm)   Pulse (!) 106   Temp 99.9 F (37.7 C) (Oral)   Resp 20   SpO2 99%   Physical Exam  Constitutional: She is oriented to person, place, and time. She appears well-developed and well-nourished. No distress.  HENT:  Head: Normocephalic and atraumatic.  Right Ear: Tympanic membrane, external ear and ear canal normal. Tympanic membrane is not erythematous and not bulging.  Left Ear: Tympanic membrane, external ear and ear canal normal.  Tympanic membrane is not erythematous and not bulging.  Nose: Mucosal edema and rhinorrhea present. Right sinus exhibits no maxillary sinus tenderness and no frontal sinus tenderness. Left sinus exhibits no maxillary sinus tenderness and no frontal sinus tenderness.  Mouth/Throat: Uvula is midline, oropharynx is clear and moist and mucous membranes are normal.  Eyes: Conjunctivae are normal. Pupils are equal, round, and reactive to light.  Neck: Normal range of motion. Neck supple.  Cardiovascular: Normal rate, regular rhythm and normal heart sounds. Exam reveals no gallop and no friction rub.  No murmur heard. Pulmonary/Chest: Effort normal and breath sounds normal. She has no decreased breath sounds. She has no wheezes. She has no rhonchi. She has no rales.  Mild tenderness to palpation of left ribs. No rashes seen.  Lymphadenopathy:    She has no cervical adenopathy.  Neurological: She is alert and oriented to person, place, and time.  Skin: Skin is warm and dry.  Psychiatric: She has a normal mood and affect. Her behavior is normal. Judgment normal.     UC Treatments / Results  Labs (all labs ordered are listed, but only abnormal results are displayed) Labs Reviewed - No data to display  EKG  EKG Interpretation None       Radiology Dg Chest 2 View  Result Date: 07/13/2017 CLINICAL DATA:  Low-grade fever.  Left-sided chest and rib pain. EXAM: CHEST  2 VIEW COMPARISON:  Two-view chest x-ray 09/13/2013 FINDINGS: Superior segment left lower lobe airspace disease is present. The heart size is normal. Lungs are otherwise clear. The visualized soft tissues and bony thorax are otherwise normal. IMPRESSION: 1. Superior segment left lower lobe pneumonia. Electronically Signed   By: San Morelle M.D.   On: 07/13/2017 17:37    Procedures Procedures (including critical care time)  Medications Ordered in UC Medications - No data to display   Initial Impression / Assessment  and Plan / UC Course  I have reviewed the triage vital signs and the nursing notes.  Pertinent labs & imaging results that were available during my care of the patient were reviewed by me and considered in my medical decision making (see chart for details).    CXR with left lower lobe pneumonia. Start azithromycin as directed. Refilled azelastine for nasal congestion. Patient without chest pain, shortness of breath, unilateral  leg swelling, O2 sat 99% without tachypnea, Wells score 1.5. Discussed with patient presents of pneumonia cannot rule out PE, to monitor for now. Strict return precautions given.   Case discussed with Dr Mannie Stabile, who agrees to plan.   Final Clinical Impressions(s) / UC Diagnoses   Final diagnoses:  Pneumonia of left lower lobe due to infectious organism [facility])    ED Discharge Orders        Ordered    azithromycin (ZITHROMAX) 250 MG tablet  Daily     07/13/17 1755    azelastine (ASTELIN) 0.1 % nasal spray  2 times daily     07/13/17 1803       Ok Edwards, PA-C 07/13/17 1839

## 2017-07-13 NOTE — Discharge Instructions (Addendum)
Chest xray with left lower lobe pneumonia. Start azithromycin as directed. Tylenol/motrin for pain and fever. Monitor for any worsening of symptoms, chest pain, shortness of breath, dizziness, weakness, leg swelling, please go to the emergency department for further evaluation.

## 2017-07-13 NOTE — ED Triage Notes (Signed)
Pt states Monday she started having congestion and post nasal drip, pt had fevers at home. Pt also c/o sharp pain in the L rib area when she takes a deep breath. Area is tender.

## 2017-07-17 ENCOUNTER — Encounter: Payer: Self-pay | Admitting: Family Medicine

## 2017-07-18 ENCOUNTER — Ambulatory Visit: Payer: BLUE CROSS/BLUE SHIELD | Admitting: Family Medicine

## 2017-07-18 ENCOUNTER — Encounter: Payer: Self-pay | Admitting: Family Medicine

## 2017-07-18 ENCOUNTER — Other Ambulatory Visit: Payer: Self-pay

## 2017-07-18 VITALS — BP 120/70 | HR 90 | Temp 99.5°F | Resp 16 | Ht 69.0 in | Wt 177.0 lb

## 2017-07-18 DIAGNOSIS — J181 Lobar pneumonia, unspecified organism: Secondary | ICD-10-CM | POA: Diagnosis not present

## 2017-07-18 DIAGNOSIS — J189 Pneumonia, unspecified organism: Secondary | ICD-10-CM

## 2017-07-18 LAB — CBC
HEMATOCRIT: 34.6 % — AB (ref 35.0–45.0)
Hemoglobin: 11.5 g/dL — ABNORMAL LOW (ref 11.7–15.5)
MCH: 31.1 pg (ref 27.0–33.0)
MCHC: 33.2 g/dL (ref 32.0–36.0)
MCV: 93.5 fL (ref 80.0–100.0)
Platelets: 347 10*3/uL (ref 140–400)
RBC: 3.7 10*6/uL — ABNORMAL LOW (ref 3.80–5.10)
RDW: 13.9 % (ref 11.0–15.0)
WBC: 11.3 10*3/uL — ABNORMAL HIGH (ref 3.8–10.8)

## 2017-07-18 LAB — INFLUENZA A AND B AG, IMMUNOASSAY
INFLUENZA A ANTIGEN: NOT DETECTED
INFLUENZA B ANTIGEN: NOT DETECTED

## 2017-07-18 MED ORDER — LEVOFLOXACIN 750 MG PO TABS: 750.0000 mg | ORAL_TABLET | Freq: Every day | ORAL | 0 refills | Status: DC

## 2017-07-18 MED ORDER — GUAIFENESIN-CODEINE 100-10 MG/5ML PO SOLN
5.0000 mL | Freq: Four times a day (QID) | ORAL | 0 refills | Status: DC | PRN
Start: 2017-07-18 — End: 2017-08-12

## 2017-07-18 NOTE — Progress Notes (Signed)
   Subjective:    Patient ID: Cheyenne Hanson, female    DOB: [birth year], 61 y.o.   MRN: [id number]  Patient presents for UC F/U (dx: PNA- fever (T max 101.5 while on meds), HA, body aches, nasal congestion)  Patient here to follow-up her pneumonia.  She was seen at the urgent care after she had had progressive symptoms over about 3 days where she initially had a headache but then started having some cough low-grade fever left side pain.  Chest x-ray was obtained which showed left lower lobe pneumonia.  She was started on azithromycin.  2 days later she began to have higher temps T-max around 101.5 while taking Tylenol and ibuprofen.  She still gets the headaches but does have history of underlying migraines.  She is also had some body aches and congestion.  Flu test was not done.  She completed her azithromycin yesterday.  She now has more cough which has some mild production.  She denies any vomiting or diarrhea.  She did explain a sensation when her fever goes up she feels very warm on the bottom of her feet.   Review Of Systems:  GEN- + fatigue, fever, weight loss,weakness, recent illness HEENT- denies eye drainage, change in vision, nasal discharge, CVS- denies chest pain, palpitations RESP- denies SOB, +cough, wheeze ABD- denies N/V, change in stools, abd pain GU- denies dysuria, hematuria, dribbling, incontinence MSK- denies joint pain, muscle aches, injury Neuro- +s headache,+ dizziness, syncope, seizure activity       Objective:    BP 120/70   Pulse 90   Temp 99.5 F (37.5 C) (Oral)   Resp 16   Ht 5\' 9"  (1.753 m)   Wt 177 lb (80.3 kg)   SpO2 94%   BMI 26.14 kg/m  GEN- NAD, alert and oriented x3 HEENT- PERRL, EOMI, non injected sclera, pink conjunctiva, MMM, oropharynx clear, nares clear rhinorrhea,no maxillary sinus tenderness Neck- Supple, no LAD  CVS- RRR, no murmur RESP- mild rhonchi left lower lobe, no wheeze, normal WOB ABD-NABS,soft,NT,ND Neuro-CNII-XII in tact,  no papilledema, no focal deficits  EXT- No edema Pulses- Radial, DP- 2+        Assessment & Plan:      Problem List Items Addressed This Visit    None    Visit Diagnoses    Community acquired pneumonia of left lower lobe of lung (HCC)    -  Primary   Influenza Neg, WBC elevated at  11.5, persistant fever despite azithromycin. Minimal sputum production at this time. Change antibiotics to Levaquin 750mg  daily. Robitussin codiene She will need repeat CXR in 3-4 weeks. If for some reason she still has high fevers and symptoms despite the antibiotic change on Friday, she will need CT of chest.    Relevant Medications   levofloxacin (LEVAQUIN) 750 MG tablet   guaiFENesin-codeine 100-10 MG/5ML syrup   Other Relevant Orders   Influenza A and B Ag, Immunoassay (Completed)   CBC (Completed)   Comprehensive metabolic panel      Note: This dictation was prepared with Dragon dictation along with smaller phrase technology. Any transcriptional errors that result from this process are unintentional.

## 2017-07-18 NOTE — Patient Instructions (Addendum)
Take Levaquin as prescribed Flu test negative  Continue alternating the tylenol/motrin for fever Call me Friday

## 2017-07-19 ENCOUNTER — Other Ambulatory Visit: Payer: Self-pay | Admitting: *Deleted

## 2017-07-19 DIAGNOSIS — R7989 Other specified abnormal findings of blood chemistry: Secondary | ICD-10-CM

## 2017-07-19 DIAGNOSIS — R945 Abnormal results of liver function studies: Principal | ICD-10-CM

## 2017-07-19 LAB — COMPREHENSIVE METABOLIC PANEL
AG Ratio: 1.1 (calc) (ref 1.0–2.5)
ALT: 48 U/L — AB (ref 6–29)
AST: 31 U/L (ref 10–35)
Albumin: 3.2 g/dL — ABNORMAL LOW (ref 3.6–5.1)
Alkaline phosphatase (APISO): 84 U/L (ref 33–130)
BUN: 9 mg/dL (ref 7–25)
CO2: 21 mmol/L (ref 20–32)
CREATININE: 0.7 mg/dL (ref 0.50–0.99)
Calcium: 8.3 mg/dL — ABNORMAL LOW (ref 8.6–10.4)
Chloride: 105 mmol/L (ref 98–110)
GLOBULIN: 2.8 g/dL (ref 1.9–3.7)
GLUCOSE: 100 mg/dL — AB (ref 65–99)
Potassium: 3.6 mmol/L (ref 3.5–5.3)
SODIUM: 141 mmol/L (ref 135–146)
Total Bilirubin: 0.4 mg/dL (ref 0.2–1.2)
Total Protein: 6 g/dL — ABNORMAL LOW (ref 6.1–8.1)

## 2017-07-20 ENCOUNTER — Encounter (HOSPITAL_COMMUNITY): Payer: Self-pay | Admitting: Emergency Medicine

## 2017-07-20 ENCOUNTER — Ambulatory Visit
Admission: RE | Admit: 2017-07-20 | Discharge: 2017-07-20 | Disposition: A | Discharge status: Home / Self Care | Payer: BLUE CROSS/BLUE SHIELD | Source: Ambulatory Visit | Attending: Family Medicine | Admitting: Family Medicine

## 2017-07-20 ENCOUNTER — Telehealth: Payer: Self-pay | Admitting: Family Medicine

## 2017-07-20 ENCOUNTER — Inpatient Hospital Stay (HOSPITAL_COMMUNITY)
Admission: EM | Admit: 2017-07-20 | Discharge: 2017-07-22 | DRG: 195 | Disposition: A | Discharge status: Home / Self Care | Payer: BLUE CROSS/BLUE SHIELD | Attending: Internal Medicine | Admitting: Internal Medicine

## 2017-07-20 DIAGNOSIS — Z8349 Family history of other endocrine, nutritional and metabolic diseases: Secondary | ICD-10-CM

## 2017-07-20 DIAGNOSIS — Z823 Family history of stroke: Secondary | ICD-10-CM

## 2017-07-20 DIAGNOSIS — J189 Pneumonia, unspecified organism: Secondary | ICD-10-CM

## 2017-07-20 DIAGNOSIS — Z8249 Family history of ischemic heart disease and other diseases of the circulatory system: Secondary | ICD-10-CM

## 2017-07-20 DIAGNOSIS — J181 Lobar pneumonia, unspecified organism: Principal | ICD-10-CM

## 2017-07-20 DIAGNOSIS — D649 Anemia, unspecified: Secondary | ICD-10-CM | POA: Diagnosis present

## 2017-07-20 DIAGNOSIS — F419 Anxiety disorder, unspecified: Secondary | ICD-10-CM | POA: Diagnosis present

## 2017-07-20 DIAGNOSIS — Z888 Allergy status to other drugs, medicaments and biological substances status: Secondary | ICD-10-CM

## 2017-07-20 DIAGNOSIS — Z803 Family history of malignant neoplasm of breast: Secondary | ICD-10-CM

## 2017-07-20 DIAGNOSIS — Z23 Encounter for immunization: Secondary | ICD-10-CM

## 2017-07-20 DIAGNOSIS — G4733 Obstructive sleep apnea (adult) (pediatric): Secondary | ICD-10-CM | POA: Diagnosis present

## 2017-07-20 DIAGNOSIS — R509 Fever, unspecified: Secondary | ICD-10-CM | POA: Diagnosis not present

## 2017-07-20 DIAGNOSIS — E785 Hyperlipidemia, unspecified: Secondary | ICD-10-CM | POA: Diagnosis present

## 2017-07-20 DIAGNOSIS — Z91040 Latex allergy status: Secondary | ICD-10-CM

## 2017-07-20 HISTORY — DX: Headache: R51

## 2017-07-20 HISTORY — DX: Obstructive sleep apnea (adult) (pediatric): G47.33

## 2017-07-20 HISTORY — DX: Gastro-esophageal reflux disease without esophagitis: K21.9

## 2017-07-20 HISTORY — DX: Dependence on other enabling machines and devices: Z99.89

## 2017-07-20 HISTORY — DX: Nonrheumatic mitral (valve) prolapse: I34.1

## 2017-07-20 HISTORY — DX: Other seasonal allergic rhinitis: J30.2

## 2017-07-20 HISTORY — DX: Pneumonia, unspecified organism: J18.9

## 2017-07-20 HISTORY — DX: Headache, unspecified: R51.9

## 2017-07-20 HISTORY — DX: Basal cell carcinoma of skin, unspecified: C44.91

## 2017-07-20 HISTORY — DX: Anemia, unspecified: D64.9

## 2017-07-20 LAB — COMPREHENSIVE METABOLIC PANEL
ALT: 61 U/L — ABNORMAL HIGH (ref 14–54)
AST: 47 U/L — ABNORMAL HIGH (ref 15–41)
Albumin: 2.6 g/dL — ABNORMAL LOW (ref 3.5–5.0)
Alkaline Phosphatase: 86 U/L (ref 38–126)
Anion gap: 10 (ref 5–15)
BUN: 15 mg/dL (ref 6–20)
CHLORIDE: 104 mmol/L (ref 101–111)
CO2: 23 mmol/L (ref 22–32)
Calcium: 8.7 mg/dL — ABNORMAL LOW (ref 8.9–10.3)
Creatinine, Ser: 0.83 mg/dL (ref 0.44–1.00)
GFR calc Af Amer: >60 mL/min (ref >60)
GFR calc non Af Amer: >60 mL/min (ref >60)
Glucose, Bld: 107 mg/dL — ABNORMAL HIGH (ref 65–99)
POTASSIUM: 3.5 mmol/L (ref 3.5–5.1)
Sodium: 137 mmol/L (ref 135–145)
TOTAL PROTEIN: 6.6 g/dL (ref 6.5–8.1)
Total Bilirubin: 0.1 mg/dL — ABNORMAL LOW (ref 0.3–1.2)

## 2017-07-20 LAB — CBC WITH DIFFERENTIAL/PLATELET
Basophils Absolute: 0.1 10*3/uL (ref 0.0–0.1)
Basophils Relative: 1 %
Eosinophils Absolute: 0.3 10*3/uL (ref 0.0–0.7)
Eosinophils Relative: 3 %
HCT: 35.5 % — ABNORMAL LOW (ref 36.0–46.0)
Hemoglobin: 11.6 g/dL — ABNORMAL LOW (ref 12.0–15.0)
LYMPHS ABS: 1.6 10*3/uL (ref 0.7–4.0)
LYMPHS PCT: 17 %
MCH: 29.6 pg (ref 26.0–34.0)
MCHC: 32.7 g/dL (ref 30.0–36.0)
MCV: 90.6 fL (ref 78.0–100.0)
MONO ABS: 0.6 10*3/uL (ref 0.1–1.0)
Monocytes Relative: 7 %
Neutro Abs: 6.9 10*3/uL (ref 1.7–7.7)
Neutrophils Relative %: 72 %
PLATELETS: 428 10*3/uL — AB (ref 150–400)
RBC: 3.92 MIL/uL (ref 3.87–5.11)
RDW: 13.6 % (ref 11.5–15.5)
WBC: 9.5 10*3/uL (ref 4.0–10.5)

## 2017-07-20 LAB — I-STAT CG4 LACTIC ACID, ED: Lactic Acid, Venous: 0.92 mmol/L (ref 0.5–1.9)

## 2017-07-20 MED ORDER — DEXTROSE 5 % IV SOLN
1.0000 g | Freq: Once | INTRAVENOUS | Status: AC
  Administered 2017-07-21: 1 g via INTRAVENOUS
  Filled 2017-07-20: qty 1

## 2017-07-20 MED ORDER — VANCOMYCIN HCL 10 G IV SOLR
1500.0000 mg | Freq: Once | INTRAVENOUS | Status: AC
  Administered 2017-07-21: 1500 mg via INTRAVENOUS
  Filled 2017-07-20: qty 1500

## 2017-07-20 MED ORDER — SODIUM CHLORIDE 0.9 % IV BOLUS (SEPSIS)
1000.0000 mL | Freq: Once | INTRAVENOUS | Status: AC
  Administered 2017-07-20: 1000 mL via INTRAVENOUS

## 2017-07-20 NOTE — Telephone Encounter (Signed)
Send her for Urgent CT of chest, Fever, CAP

## 2017-07-20 NOTE — Progress Notes (Signed)
Pharmacy Antibiotic Note  Cheyenne Hanson is a 61 y.o. female admitted on 07/20/2017 with pneumonia.  Pharmacy has been consulted for vancomycin dosing. Cefepime and Azithromycin per MD.   WBC within normal limits and afebrile. SCr 0.83 for estimated CrCl ~ 80 mL/min.   Patient received cefepime 1g IV x1 in the ED.   Plan: Vancomycin 1.5g IV x1, then 1g IV q12hr  Cefepime 1g IV q8hr per MD - dose appropriate  Azithromycin 500 mg IV q24hr - dose appropriate  Vancomycin trough at steady state and PRN (goal 15-53mcg/mL) Monitor renal function, clinical picture, and culture data F/u length of therapy and de-escalation   Temp (24hrs), Avg:98.7 F (37.1 C), Min:98.7 F (37.1 C), Max:98.7 F (37.1 C)  Recent Labs  Lab 07/18/17 1538 07/20/17 2132  WBC 11.3* 9.5  CREATININE 0.70 0.83    Estimated Creatinine Clearance: 81.7 mL/min (by C-G formula based on SCr of 0.83 mg/dL).    Allergies  Allergen Reactions  . Latex Other (See Comments)    Blisters if she wears the gloves  . Statins     Muscle aches   Antimicrobials this admission: 11/29 Vanc >>  11/29 Cefepime >> 11/29 Azith >>   Microbiology results: pending   Lavonda Jumbo, PharmD Clinical Pharmacist 07/20/17 11:55 PM

## 2017-07-20 NOTE — Telephone Encounter (Signed)
Husband calling, concerned wife is not getting better.  Still running low grade fever 99.7.  Headache gone but is still very weak.  Denies any respiratory distress.  Please advise?

## 2017-07-20 NOTE — ED Provider Notes (Signed)
[facility] EMERGENCY DEPARTMENT Provider Note   CSN: [id number] Arrival date & time: 07/20/17  2024     History   Chief Complaint Chief Complaint  Patient presents with  . Pneumonia    HPI Cheyenne Hanson is a 61 y.o. female.  Patient sent by PCP for persistent left-sided pneumonia.  States has been sick since November 20 with cough, left-sided pleuritic chest pain, headache and fevers.  She was diagnosed with left-sided pneumonia by x-ray and prescribed Zithromax which she completed on November 25 she saw her PCP the next day and was prescribed Levaquin for which she has been on for 3 days.  Had outpatient CT scan today that showed persistent left-sided infiltrate with pleural effusion.. She continues to feel body aches, headache, left-sided pleuritic chest pain.  States cough is minimal and nonproductive.  No leg pain or leg swelling.  No history of heart or lung problems.  No recent travel or sick contacts.  No vomiting or diarrhea. Good PO intake and urine output.   The history is provided by the patient and a relative.  Pneumonia  Associated symptoms include headaches and shortness of breath. Pertinent negatives include no chest pain and no abdominal pain.    Past Medical History:  Diagnosis Date  . Allergy   . Anxiety    uses klonopin sparingly (30 lasts a year)  . Hyperlipidemia   . Sleep apnea    cpap since 2008    There are no active problems to display for this patient.   Past Surgical History:  Procedure Laterality Date  . ARTHROSCOPIC REPAIR ACL    . SPINE SURGERY     fusion cervical spine Dr. Ronnald Ramp    OB History    No data available       Home Medications    Prior to Admission medications   Medication Sig Start Date End Date Taking? Authorizing Provider  azelastine (ASTELIN) 0.1 % nasal spray Place 2 sprays into both nostrils 2 (two) times daily. Use in each nostril as directed 07/13/17 10/11/17  Ok Edwards, PA-C  clonazePAM  (KLONOPIN) 0.5 MG tablet Take 0.5 mg by mouth at bedtime.  11/25/16   [provider]  ezetimibe (ZETIA) 10 MG tablet Take 10 mg by mouth daily.    [provider]  guaiFENesin (MUCINEX) 600 MG 12 hr tablet Take by mouth 2 (two) times daily.    [provider]  guaiFENesin-codeine 100-10 MG/5ML syrup Take 5 mLs by mouth every 6 (six) hours as needed. 07/18/17   [place], Modena Nunnery, MD  ibuprofen (ADVIL,MOTRIN) 200 MG tablet Take 600 mg by mouth every 6 (six) hours as needed for headache or moderate pain.    [provider]  levofloxacin (LEVAQUIN) 750 MG tablet Take 1 tablet (750 mg total) by mouth daily. 07/18/17   Alycia Rossetti, MD  loratadine (CLARITIN) 10 MG tablet Take 10 mg by mouth daily.    [provider]  valACYclovir (VALTREX) 1000 MG tablet Take 1,000 mg by mouth 2 (two) times daily. Prn Fever blisters    [provider]    Family History Family History  Problem Relation Age of Onset  . Cancer Father        lung  . Stroke Sister   . Hyperlipidemia Sister   . Heart disease Brother   . Hyperlipidemia Brother   . Breast cancer Cousin     Social History Social History   Tobacco Use  .  Smoking status: Never Smoker  . Smokeless tobacco: Never Used  Substance Use Topics  . Alcohol use: No    Frequency: Never  . Drug use: No     Allergies   Latex and Statins   Review of Systems Review of Systems  Constitutional: Positive for activity change, appetite change and fever.  HENT: Positive for congestion. Negative for postnasal drip and sinus pressure.   Eyes: Negative for visual disturbance.  Respiratory: Positive for cough and shortness of breath.   Cardiovascular: Negative for chest pain and leg swelling.  Gastrointestinal: Negative for abdominal pain, nausea and vomiting.  Genitourinary: Negative for dysuria, hematuria, vaginal bleeding and vaginal discharge.  Musculoskeletal: Positive for arthralgias and  myalgias.  Skin: Negative for rash.  Neurological: Positive for headaches. Negative for dizziness and weakness.   all other systems are negative except as noted in the HPI and PMH.     Physical Exam Updated Vital Signs BP 139/86 (BP Location: Right Arm)   Pulse 86   Temp 98.7 F (37.1 C) (Oral)   Resp 18   SpO2 98%   Physical Exam  Constitutional: She is oriented to person, place, and time. She appears well-developed and well-nourished. No distress.  HENT:  Head: Normocephalic and atraumatic.  Mouth/Throat: Oropharynx is clear and moist. No oropharyngeal exudate.  Eyes: Conjunctivae and EOM are normal. Pupils are equal, round, and reactive to light.  Neck: Normal range of motion. Neck supple.  No meningismus.  Cardiovascular: Normal rate, regular rhythm, normal heart sounds and intact distal pulses.  No murmur heard. Pulmonary/Chest: Effort normal and breath sounds normal. No respiratory distress. She exhibits no tenderness.  L sided rhonchi  Abdominal: Soft. There is no tenderness. There is no rebound and no guarding.  Musculoskeletal: Normal range of motion. She exhibits no edema or tenderness.  Neurological: She is alert and oriented to person, place, and time. No cranial nerve deficit. She exhibits normal muscle tone. Coordination normal.  No ataxia on finger to nose bilaterally. No pronator drift. 5/5 strength throughout. CN 2-12 intact.Equal grip strength. Sensation intact.   Skin: Skin is warm. Capillary refill takes less than 2 seconds.  Psychiatric: She has a normal mood and affect. Her behavior is normal.  Nursing note and vitals reviewed.    ED Treatments / Results  Labs (all labs ordered are listed, but only abnormal results are displayed) Labs Reviewed  CBC WITH DIFFERENTIAL/PLATELET - Abnormal; Notable for the following components:      Result Value   Hemoglobin 11.6 (*)    HCT 35.5 (*)    Platelets 428 (*)    All other components within normal limits    COMPREHENSIVE METABOLIC PANEL - Abnormal; Notable for the following components:   Glucose, Bld 107 (*)    Calcium 8.7 (*)    Albumin 2.6 (*)    AST 47 (*)    ALT 61 (*)    Total Bilirubin 0.1 (*)    All other components within normal limits  D-DIMER, QUANTITATIVE (NOT AT [facility]) - Abnormal; Notable for the following components:   D-Dimer, Quant 3.06 (*)    All other components within normal limits  CULTURE, BLOOD (ROUTINE X 2)  CULTURE, BLOOD (ROUTINE X 2)  INFLUENZA PANEL BY PCR (TYPE A & B)  TROPONIN I  BRAIN NATRIURETIC PEPTIDE  I-STAT CG4 LACTIC ACID, ED  I-STAT CG4 LACTIC ACID, ED    EKG  EKG Interpretation  Date/Time:  Thursday July 21 2017 01:14:11 EST Ventricular Rate:  83 PR Interval:    QRS Duration: 72 QT Interval:  384 QTC Calculation: 452 R Axis:   78 Text Interpretation:  Sinus rhythm RSR' in V1 or V2, probably normal variant No significant change was found Confirmed by Ezequiel Essex [phone]) on 07/21/2017 1:35:20 AM       Radiology Ct Chest Wo Contrast  Result Date: 07/20/2017 CLINICAL DATA:  61 y/o F; left chest pain, cough and fever with pneumonia. EXAM: CT CHEST WITHOUT CONTRAST TECHNIQUE: Multidetector CT imaging of the chest was performed following the standard protocol without IV contrast. COMPARISON:  07/13/2017 chest radiograph FINDINGS: Cardiovascular: No significant vascular findings. Normal heart size. No pericardial effusion. Mediastinum/Nodes: Left lower paratracheal lymphadenopathy, likely reactive. Normal thyroid gland. Normal thoracic esophagus. Lungs/Pleura: Left lower lobe consolidation and small left effusion are increased from prior radiograph. Biapical pleuroparenchymal scarring. Upper Abdomen: Right lobe of liver 24 mm cyst and subcentimeter lucency in the dome a right liver, too small to characterize, but likely additional cysts. Musculoskeletal: No chest wall mass or suspicious bone lesions identified. IMPRESSION: Left lower lobe  pneumonia and small left pleural effusion increased from prior radiographs. Electronically Signed   By: Kristine Garbe M.D.   On: 07/20/2017 14:47   Ct Angio Chest Pe W And/or Wo Contrast  Result Date: 07/21/2017 CLINICAL DATA:  Left-sided chest tightness.  Cough and headache. EXAM: CT ANGIOGRAPHY CHEST WITH CONTRAST TECHNIQUE: Multidetector CT imaging of the chest was performed using the standard protocol during bolus administration of intravenous contrast. Multiplanar CT image reconstructions and MIPs were obtained to evaluate the vascular anatomy. CONTRAST:  35mL ISOVUE-370 IOPAMIDOL (ISOVUE-370) INJECTION 76% COMPARISON:  Noncontrast chest CT performed yesterday. FINDINGS: Cardiovascular: There are no filling defects within the pulmonary arteries to suggest pulmonary embolus. Normal caliber thoracic aorta. Heart is normal in size. No pericardial effusion. Mediastinum/Nodes: Prominent left hilar node measures 10 mm. Enlarged left paratracheal node measures 12 mm. No right hilar adenopathy. The esophagus is decompressed. Visualized thyroid gland is normal. Lungs/Pleura: Ground-glass and consolidative opacities within the left lower lobe as seen on CT yesterday. Small-moderate left pleural effusion slightly increased, adjacent atelectasis in the dependent left upper lobe. Again seen biapical pleuroparenchymal scarring. No pulmonary edema. No right pleural effusion. Fissural thickening of the minor fissure on the right likely an intrapulmonary lymph node. Upper Abdomen: Subcapsular 2.4 cm hypodense lesion in the right lobe of the liver, likely cyst. No acute finding. Musculoskeletal: There are no acute or suspicious osseous abnormalities. Review of the MIP images confirms the above findings. IMPRESSION: 1. No pulmonary embolus. 2. Ground-glass consolidative opacity in the left lower lobe likely pneumonia, unchanged in CT yesterday. Small to moderate left pleural effusion has slightly increased in the  interim, with adjacent compressive atelectasis in the dependent left upper lobe. Recommend radiographic follow-up to resolution after course of treatment in 3-4 weeks. Electronically Signed   By: Jeb Levering M.D.   On: 07/21/2017 01:55    Procedures Procedures (including critical care time)  Medications Ordered in ED Medications - No data to display   Initial Impression / Assessment and Plan / ED Course  I have reviewed the triage vital signs and the nursing notes.  Pertinent labs & imaging results that were available during my care of the patient were reviewed by me and considered in my medical decision making (see chart for details).    Patient with 8 days of cough, shortness of breath fever and pleuritic chest pain.  Sent to the ED with persistent  pneumonia on CT scan.  Patient is in no distress.  No hypoxia.  Vitals are stable. CT scan from earlier today reviewed that shows left sided pneumonia with pleural effusion.  This was done without contrast however.  Persistent pleuritic chest pain will obtain d-dimer as pneumonia is not improving despite adequate antibiotic treatment.  CT shows no pulmonary embolism but does show persistent left-sided pneumonia with pleural effusion.  IV antibiotics started.  Patient nontoxic with stable vitals.  She is not hypoxic.  Patient does not seem to be improving despite 2 courses of outpatient antibiotics.  This appears why she was sent in by her PCP.  We will plan observation for IV antibiotics.  Discussed with Dr. Hal Hope.  Final Clinical Impressions(s) / ED Diagnoses   Final diagnoses:  Community acquired pneumonia of left lower lobe of lung [facility])    ED Discharge Orders    None       Roy Snuffer, Annie Main, MD 07/21/17 [phone]

## 2017-07-20 NOTE — ED Triage Notes (Signed)
Patient advised by her PCP to go to ER - CT scan result taken at [facility] today shows worsening Pneumonia , currently taking Levaquin antibiotic with no improvement , respirations unlabored / occasional dry cough with left lateral ribcage pain with deep inspirations .

## 2017-07-20 NOTE — Telephone Encounter (Signed)
Spoke to husband, to go to [facility] now for CT Scan, no pre auth needed.  Will call them after results with provider recommendations

## 2017-07-21 ENCOUNTER — Encounter (HOSPITAL_COMMUNITY): Payer: Self-pay | Admitting: Internal Medicine

## 2017-07-21 ENCOUNTER — Other Ambulatory Visit: Payer: Self-pay

## 2017-07-21 ENCOUNTER — Emergency Department (HOSPITAL_COMMUNITY): Payer: BLUE CROSS/BLUE SHIELD

## 2017-07-21 DIAGNOSIS — Z888 Allergy status to other drugs, medicaments and biological substances status: Secondary | ICD-10-CM | POA: Diagnosis not present

## 2017-07-21 DIAGNOSIS — G4733 Obstructive sleep apnea (adult) (pediatric): Secondary | ICD-10-CM | POA: Diagnosis present

## 2017-07-21 DIAGNOSIS — Z91040 Latex allergy status: Secondary | ICD-10-CM | POA: Diagnosis not present

## 2017-07-21 DIAGNOSIS — J181 Lobar pneumonia, unspecified organism: Principal | ICD-10-CM

## 2017-07-21 DIAGNOSIS — D649 Anemia, unspecified: Secondary | ICD-10-CM | POA: Diagnosis present

## 2017-07-21 DIAGNOSIS — Z823 Family history of stroke: Secondary | ICD-10-CM | POA: Diagnosis not present

## 2017-07-21 DIAGNOSIS — Z23 Encounter for immunization: Secondary | ICD-10-CM | POA: Diagnosis not present

## 2017-07-21 DIAGNOSIS — Z8349 Family history of other endocrine, nutritional and metabolic diseases: Secondary | ICD-10-CM | POA: Diagnosis not present

## 2017-07-21 DIAGNOSIS — Z803 Family history of malignant neoplasm of breast: Secondary | ICD-10-CM | POA: Diagnosis not present

## 2017-07-21 DIAGNOSIS — R509 Fever, unspecified: Secondary | ICD-10-CM | POA: Diagnosis present

## 2017-07-21 DIAGNOSIS — E785 Hyperlipidemia, unspecified: Secondary | ICD-10-CM | POA: Diagnosis present

## 2017-07-21 DIAGNOSIS — Z8249 Family history of ischemic heart disease and other diseases of the circulatory system: Secondary | ICD-10-CM | POA: Diagnosis not present

## 2017-07-21 DIAGNOSIS — J189 Pneumonia, unspecified organism: Secondary | ICD-10-CM | POA: Diagnosis not present

## 2017-07-21 DIAGNOSIS — F419 Anxiety disorder, unspecified: Secondary | ICD-10-CM | POA: Diagnosis present

## 2017-07-21 LAB — D-DIMER, QUANTITATIVE (NOT AT ARMC): D DIMER QUANT: 3.06 ug{FEU}/mL — AB (ref 0.00–0.50)

## 2017-07-21 LAB — INFLUENZA PANEL BY PCR (TYPE A & B)
INFLAPCR: NEGATIVE
Influenza B By PCR: NEGATIVE

## 2017-07-21 LAB — TROPONIN I: Troponin I: <0.03 ng/mL (ref <0.03)

## 2017-07-21 LAB — HIV ANTIBODY (ROUTINE TESTING W REFLEX): HIV Screen 4th Generation wRfx: NONREACTIVE

## 2017-07-21 LAB — BRAIN NATRIURETIC PEPTIDE: B NATRIURETIC PEPTIDE 5: 75.9 pg/mL (ref 0.0–100.0)

## 2017-07-21 LAB — STREP PNEUMONIAE URINARY ANTIGEN: Strep Pneumo Urinary Antigen: NEGATIVE

## 2017-07-21 LAB — PROCALCITONIN: Procalcitonin: <0.1 ng/mL

## 2017-07-21 MED ORDER — ENOXAPARIN SODIUM 40 MG/0.4ML ~~LOC~~ SOLN: 40.0000 mg | Freq: Every day | SUBCUTANEOUS | Status: DC

## 2017-07-21 MED ORDER — DEXTROSE 5 % IV SOLN
1.0000 g | Freq: Three times a day (TID) | INTRAVENOUS | Status: DC
  Administered 2017-07-21 – 2017-07-22 (×3): 1 g via INTRAVENOUS
  Filled 2017-07-21 (×4): qty 1

## 2017-07-21 MED ORDER — CLONAZEPAM 0.5 MG PO TABS: 0.5000 mg | ORAL_TABLET | Freq: Every evening | ORAL | Status: DC | PRN

## 2017-07-21 MED ORDER — EZETIMIBE 10 MG PO TABS
10.0000 mg | ORAL_TABLET | Freq: Every evening | ORAL | Status: DC
  Administered 2017-07-21: 10 mg via ORAL
  Filled 2017-07-21: qty 1

## 2017-07-21 MED ORDER — ACETAMINOPHEN 325 MG PO TABS
650.0000 mg | ORAL_TABLET | Freq: Four times a day (QID) | ORAL | Status: DC | PRN
  Administered 2017-07-21 – 2017-07-22 (×3): 650 mg via ORAL
  Filled 2017-07-21 (×3): qty 2

## 2017-07-21 MED ORDER — DEXTROSE 5 % IV SOLN
1.0000 g | Freq: Three times a day (TID) | INTRAVENOUS | Status: DC
  Administered 2017-07-21: 1 g via INTRAVENOUS
  Filled 2017-07-21 (×5): qty 1

## 2017-07-21 MED ORDER — ACETAMINOPHEN 650 MG RE SUPP: 650.0000 mg | Freq: Four times a day (QID) | RECTAL | Status: DC | PRN

## 2017-07-21 MED ORDER — DEXTROSE 5 % IV SOLN
500.0000 mg | Freq: Every day | INTRAVENOUS | Status: DC
  Administered 2017-07-21 – 2017-07-22 (×2): 500 mg via INTRAVENOUS
  Filled 2017-07-21 (×3): qty 500

## 2017-07-21 MED ORDER — VANCOMYCIN HCL IN DEXTROSE 1-5 GM/200ML-% IV SOLN
1000.0000 mg | Freq: Two times a day (BID) | INTRAVENOUS | Status: DC
  Administered 2017-07-21 – 2017-07-22 (×2): 1000 mg via INTRAVENOUS
  Filled 2017-07-21 (×3): qty 200

## 2017-07-21 MED ORDER — IOPAMIDOL (ISOVUE-370) INJECTION 76%
INTRAVENOUS | Status: AC
  Administered 2017-07-21: 100 mL
  Filled 2017-07-21: qty 100

## 2017-07-21 MED ORDER — ONDANSETRON HCL 4 MG/2ML IJ SOLN: 4.0000 mg | Freq: Four times a day (QID) | INTRAMUSCULAR | Status: DC | PRN

## 2017-07-21 MED ORDER — IBUPROFEN 400 MG PO TABS
400.0000 mg | ORAL_TABLET | Freq: Once | ORAL | Status: AC
  Administered 2017-07-21: 400 mg via ORAL
  Filled 2017-07-21: qty 1

## 2017-07-21 MED ORDER — ONDANSETRON HCL 4 MG PO TABS: 4.0000 mg | ORAL_TABLET | Freq: Four times a day (QID) | ORAL | Status: DC | PRN

## 2017-07-21 MED ORDER — GUAIFENESIN ER 600 MG PO TB12
600.0000 mg | ORAL_TABLET | Freq: Three times a day (TID) | ORAL | Status: DC
  Administered 2017-07-21 – 2017-07-22 (×5): 600 mg via ORAL
  Filled 2017-07-21 (×6): qty 1

## 2017-07-21 NOTE — ED Notes (Signed)
Patient denies pain and is resting comfortably.  

## 2017-07-21 NOTE — ED Notes (Signed)
PT ambulates to restroom without assistance.  

## 2017-07-21 NOTE — ED Notes (Addendum)
Heart Healthy diet breakfast tray ordered @ 0615.  

## 2017-07-21 NOTE — Progress Notes (Signed)
PROGRESS NOTE   Cheyenne Hanson  DTO:[id number]    DOB: 30-Jul-1956    DOA: 07/20/2017  PCP: Susy Frizzle, MD   I have briefly reviewed patients previous medical records in [facility].  Brief Narrative:  61 year old female with PMH of HLD, anxiety, OSA on nightly CPAP, presented to ED with approximately 10 day history of persistent cough, fever, chills despite completing a course of azithromycin and 3 days of levofloxacin for outpatient diagnosis of pneumonia. PCP ordered CT chest which showed left lower lobe pneumonia and pleural effusion and was advised to come to ED. No sick contacts.   Assessment & Plan:   Principal Problem:   CAP (community acquired pneumonia) Active Problems:   HLD (hyperlipidemia)   1. Lobar pneumonia (left lower lobe)/community-acquired pneumonia: Failed outpatient oral antibiotics (azithromycin >levofloxacin). Empirically started on IV vancomycin, cefepime and azithromycin. Follow urine streptococcal and legionella antigen. Patient unable to provide sputum, mostly dry cough at this time. Blood cultures pending. HIV screen nonreactive. Influenza panel PCR negative. Interestingly pro-calcitonin <0.1. Effusion is probably parapneumonic. Will need follow-up chest x-ray in 4 weeks to ensure resolution of pneumonia findings. CTA chest negative for PE. 2. Hyperlipidemia: Continue Zyrtec clear. 3. OSA: Continue nightly CPAP. 4. Anemia: Stable. 5. Mild transaminitis: Unclear etiology.? Related to problem #1. Outpatient follow-up.   DVT prophylaxis: SCDs Code Status: Full Family Communication: Discussed in detail with patient's spouse at bedside. Updated care and answered questions. Disposition: DC home possibly in the next 1-2 days.   Consultants:  None   Procedures:  None  Antimicrobials:  IV cefepime, vancomycin and azithromycin.    Subjective: States that she feels much better. Mostly dry cough. No chest pain or dyspnea reported. Fevers have  subsided.   ROS: No dizziness or lightheadedness.  Objective:  Vitals:   07/21/17 1100 07/21/17 1200 07/21/17 1400 07/21/17 1546  BP: 129/75 116/78 118/64 135/83  Pulse: 81 77 67 72  Resp: 18 18 16 18   Temp:      TempSrc:      SpO2: 95% 95% 95% 98%    Examination:  General exam: Pleasant 61-year-old female, moderately built and nourished, lying comfortably propped up in bed. Does not look septic or toxic. Respiratory system: Reduced breath sounds in the left base with occasional crackles. Rest of lung fields clear to auscultation. Respiratory effort normal. Cardiovascular system: S1 & S2 heard, RRR. No JVD, murmurs, rubs, gallops or clicks. No pedal edema. Gastrointestinal system: Abdomen is nondistended, soft and nontender. No organomegaly or masses felt. Normal bowel sounds heard. Central nervous system: Alert and oriented. No focal neurological deficits. Extremities: Symmetric 5 x 5 power. Skin: No rashes, lesions or ulcers Psychiatry: Judgement and insight appear normal. Mood & affect appropriate.     Data Reviewed: I have personally reviewed following labs and imaging studies  CBC: Recent Labs  Lab 07/18/17 1538 07/20/17 2132  WBC 11.3* 9.5  NEUTROABS  --  6.9  HGB 11.5* 11.6*  HCT 34.6* 35.5*  MCV 93.5 90.6  PLT 347 983*   Basic Metabolic Panel: Recent Labs  Lab 07/18/17 1538 07/20/17 2132  NA 141 137  K 3.6 3.5  CL 105 104  CO2 21 23  GLUCOSE 100* 107*  BUN 9 15  CREATININE 0.70 0.83  CALCIUM 8.3* 8.7*   Liver Function Tests: Recent Labs  Lab 07/18/17 1538 07/20/17 2132  AST 31 47*  ALT 48* 61*  ALKPHOS  --  86  BILITOT 0.4 0.1*  PROT  6.0* 6.6  ALBUMIN  --  2.6*   Coagulation Profile: No results for input(s): INR, PROTIME in the last 168 hours. Cardiac Enzymes: Recent Labs  Lab 07/20/17 2340  TROPONINI <0.03        Radiology Studies: Ct Chest Wo Contrast  Result Date: 07/20/2017 CLINICAL DATA:  61 y/o F; left chest pain,  cough and fever with pneumonia. EXAM: CT CHEST WITHOUT CONTRAST TECHNIQUE: Multidetector CT imaging of the chest was performed following the standard protocol without IV contrast. COMPARISON:  07/13/2017 chest radiograph FINDINGS: Cardiovascular: No significant vascular findings. Normal heart size. No pericardial effusion. Mediastinum/Nodes: Left lower paratracheal lymphadenopathy, likely reactive. Normal thyroid gland. Normal thoracic esophagus. Lungs/Pleura: Left lower lobe consolidation and small left effusion are increased from prior radiograph. Biapical pleuroparenchymal scarring. Upper Abdomen: Right lobe of liver 24 mm cyst and subcentimeter lucency in the dome a right liver, too small to characterize, but likely additional cysts. Musculoskeletal: No chest wall mass or suspicious bone lesions identified. IMPRESSION: Left lower lobe pneumonia and small left pleural effusion increased from prior radiographs. Electronically Signed   By: Kristine Garbe M.D.   On: 07/20/2017 14:47   Ct Angio Chest Pe W And/or Wo Contrast  Result Date: 07/21/2017 CLINICAL DATA:  Left-sided chest tightness.  Cough and headache. EXAM: CT ANGIOGRAPHY CHEST WITH CONTRAST TECHNIQUE: Multidetector CT imaging of the chest was performed using the standard protocol during bolus administration of intravenous contrast. Multiplanar CT image reconstructions and MIPs were obtained to evaluate the vascular anatomy. CONTRAST:  8mL ISOVUE-370 IOPAMIDOL (ISOVUE-370) INJECTION 76% COMPARISON:  Noncontrast chest CT performed yesterday. FINDINGS: Cardiovascular: There are no filling defects within the pulmonary arteries to suggest pulmonary embolus. Normal caliber thoracic aorta. Heart is normal in size. No pericardial effusion. Mediastinum/Nodes: Prominent left hilar node measures 10 mm. Enlarged left paratracheal node measures 12 mm. No right hilar adenopathy. The esophagus is decompressed. Visualized thyroid gland is normal.  Lungs/Pleura: Ground-glass and consolidative opacities within the left lower lobe as seen on CT yesterday. Small-moderate left pleural effusion slightly increased, adjacent atelectasis in the dependent left upper lobe. Again seen biapical pleuroparenchymal scarring. No pulmonary edema. No right pleural effusion. Fissural thickening of the minor fissure on the right likely an intrapulmonary lymph node. Upper Abdomen: Subcapsular 2.4 cm hypodense lesion in the right lobe of the liver, likely cyst. No acute finding. Musculoskeletal: There are no acute or suspicious osseous abnormalities. Review of the MIP images confirms the above findings. IMPRESSION: 1. No pulmonary embolus. 2. Ground-glass consolidative opacity in the left lower lobe likely pneumonia, unchanged in CT yesterday. Small to moderate left pleural effusion has slightly increased in the interim, with adjacent compressive atelectasis in the dependent left upper lobe. Recommend radiographic follow-up to resolution after course of treatment in 3-4 weeks. Electronically Signed   By: Jeb Levering M.D.   On: 07/21/2017 01:55        Scheduled Meds: . ezetimibe  10 mg Oral QPM  . guaiFENesin  600 mg Oral TID AC & HS   Continuous Infusions: . azithromycin Stopped (07/21/17 0909)  . ceFEPime (MAXIPIME) IV    . vancomycin Stopped (07/21/17 1547)     LOS: 0 days     Vernell Leep, MD, FACP, [facility]. [facility] Pager [phone]  If 7PM-7AM, please contact night-coverage [email or web address] Password [facility] 07/21/2017, 5:31 PM

## 2017-07-21 NOTE — ED Notes (Signed)
Care handoff to [facility]

## 2017-07-21 NOTE — ED Notes (Signed)
PT requests to walk hallway with IV pump. PT's husband accompanies her.

## 2017-07-21 NOTE — ED Notes (Signed)
Patient transported to CT 

## 2017-07-21 NOTE — H&P (Signed)
History and Physical    Cheyenne Hanson NFA:[id number] DOB: 12/12/1955 DOA: 07/20/2017  PCP: Susy Frizzle, MD  Patient coming from: Home.  Chief Complaint: Persistent cough fever chills.  HPI: Cheyenne Hanson is a 61 y.o. female with history of hyperlipidemia anxiety sleep apnea presented to the ER with the patient has been having persistent cough and fever chills.  Patient states last week patient's symptoms started and was initially placed on Zithromax which patient took for 5 days after being diagnosed with pneumonia.  Later on patient was switched to Levaquin which patient had taken for 3 days.  Despite which patient was still having fever chills and productive cough.  Initially when patient symptoms started patient had left-sided chest tightness which actually improved.  The chest tightness has come back again.  Patient's PCP had ordered CT chest which showed pneumonia and effusion and was advised to come to the ER.  Patient denies any recent travel or sick contacts.  ED Course: The patient's d-dimer was elevated patient had a CT angiogram of the chest which was negative for PE.  But did show features concerning for pneumonia with left-sided moderate pleural effusion.  Patient is being admitted for IV antibiotics since patient failed outpatient treatment for pneumonia.  Review of Systems: As per HPI, rest all negative.   Past Medical History:  Diagnosis Date  . Allergy   . Anxiety    uses klonopin sparingly (30 lasts a year)  . Hyperlipidemia   . Sleep apnea    cpap since 2008    Past Surgical History:  Procedure Laterality Date  . ARTHROSCOPIC REPAIR ACL    . SPINE SURGERY     fusion cervical spine Dr. Ronnald Ramp     reports that  has never smoked. she has never used smokeless tobacco. She reports that she does not drink alcohol or use drugs.  Allergies  Allergen Reactions  . Latex Other (See Comments)    Blisters if she wears the gloves  . Statins     Muscle aches     Family History  Problem Relation Age of Onset  . Cancer Father        lung  . Stroke Sister   . Hyperlipidemia Sister   . Heart disease Brother   . Hyperlipidemia Brother   . Breast cancer Cousin     Prior to Admission medications   Medication Sig Start Date End Date Taking? Authorizing Provider  azelastine (ASTELIN) 0.1 % nasal spray Place 2 sprays into both nostrils 2 (two) times daily. Use in each nostril as directed 07/13/17 10/11/17 Yes Yu, Amy V, PA-C  clonazePAM (KLONOPIN) 0.5 MG tablet Take 0.5 mg by mouth at bedtime as needed for anxiety.  11/25/16  Yes [provider]  ezetimibe (ZETIA) 10 MG tablet Take 10 mg by mouth every evening.    Yes [provider]  guaiFENesin (MUCINEX) 600 MG 12 hr tablet Take 600 mg by mouth 4 (four) times daily.    Yes [provider]  guaiFENesin-codeine 100-10 MG/5ML syrup Take 5 mLs by mouth every 6 (six) hours as needed. 07/18/17  Yes Cache, Modena Nunnery, MD  ibuprofen (ADVIL,MOTRIN) 200 MG tablet Take 600 mg by mouth every 6 (six) hours as needed for headache or moderate pain.   Yes [provider]  levofloxacin (LEVAQUIN) 750 MG tablet Take 1 tablet (750 mg total) by mouth daily. 07/18/17  Yes [place]mar, Modena Nunnery, MD  loratadine (CLARITIN) 10 MG tablet Take 10  mg by mouth daily.   Yes [provider]    Physical Exam: Vitals:   07/21/17 0000 07/21/17 0030 07/21/17 0200 07/21/17 0245  BP: 138/78 131/77 130/71 133/73  Pulse: 86 83 76 75  Resp:   18   Temp:      TempSrc:      SpO2: 96% 98% 96% 94%      Constitutional: Moderately built and nourished. Vitals:   07/21/17 0000 07/21/17 0030 07/21/17 0200 07/21/17 0245  BP: 138/78 131/77 130/71 133/73  Pulse: 86 83 76 75  Resp:   18   Temp:      TempSrc:      SpO2: 96% 98% 96% 94%   Eyes: Anicteric no pallor. ENMT: No discharge from the ears eyes nose or mouth. Neck: No mass felt.  No neck rigidity.  No JVD appreciated. Respiratory: No  rhonchi or crepitations. Cardiovascular: S1-S2 heard no murmurs appreciated. Abdomen: Soft nontender bowel sounds present. Musculoskeletal: No edema.  No joint effusion. Skin: No rash.  Skin appears warm. Neurologic: Alert awake oriented to time place and person.  Moves all extremities. Psychiatric: Appears normal.  Normal affect.   Labs on Admission: I have personally reviewed following labs and imaging studies  CBC: Recent Labs  Lab 07/18/17 1538 07/20/17 2132  WBC 11.3* 9.5  NEUTROABS  --  6.9  HGB 11.5* 11.6*  HCT 34.6* 35.5*  MCV 93.5 90.6  PLT 347 673*   Basic Metabolic Panel: Recent Labs  Lab 07/18/17 1538 07/20/17 2132  NA 141 137  K 3.6 3.5  CL 105 104  CO2 21 23  GLUCOSE 100* 107*  BUN 9 15  CREATININE 0.70 0.83  CALCIUM 8.3* 8.7*   GFR: Estimated Creatinine Clearance: 81.7 mL/min (by C-G formula based on SCr of 0.83 mg/dL). Liver Function Tests: Recent Labs  Lab 07/18/17 1538 07/20/17 2132  AST 31 47*  ALT 48* 61*  ALKPHOS  --  86  BILITOT 0.4 0.1*  PROT 6.0* 6.6  ALBUMIN  --  2.6*   No results for input(s): LIPASE, AMYLASE in the last 168 hours. No results for input(s): AMMONIA in the last 168 hours. Coagulation Profile: No results for input(s): INR, PROTIME in the last 168 hours. Cardiac Enzymes: Recent Labs  Lab 07/20/17 2340  TROPONINI <0.03   BNP (last 3 results) No results for input(s): PROBNP in the last 8760 hours. HbA1C: No results for input(s): HGBA1C in the last 72 hours. CBG: No results for input(s): GLUCAP in the last 168 hours. Lipid Profile: No results for input(s): CHOL, HDL, LDLCALC, TRIG, CHOLHDL, LDLDIRECT in the last 72 hours. Thyroid Function Tests: No results for input(s): TSH, T4TOTAL, FREET4, T3FREE, THYROIDAB in the last 72 hours. Anemia Panel: No results for input(s): VITAMINB12, FOLATE, FERRITIN, TIBC, IRON, RETICCTPCT in the last 72 hours. Urine analysis: No results found for: COLORURINE, APPEARANCEUR,  LABSPEC, PHURINE, GLUCOSEU, HGBUR, BILIRUBINUR, KETONESUR, PROTEINUR, UROBILINOGEN, NITRITE, LEUKOCYTESUR Sepsis Labs: @LABRCNTIP (procalcitonin:4,lacticidven:4) )No results found for this or any previous visit (from the past 240 hour(s)).   Radiological Exams on Admission: Ct Chest Wo Contrast  Result Date: 07/20/2017 CLINICAL DATA:  61 y/o F; left chest pain, cough and fever with pneumonia. EXAM: CT CHEST WITHOUT CONTRAST TECHNIQUE: Multidetector CT imaging of the chest was performed following the standard protocol without IV contrast. COMPARISON:  07/13/2017 chest radiograph FINDINGS: Cardiovascular: No significant vascular findings. Normal heart size. No pericardial effusion. Mediastinum/Nodes: Left lower paratracheal lymphadenopathy, likely reactive. Normal thyroid gland. Normal thoracic esophagus.  Lungs/Pleura: Left lower lobe consolidation and small left effusion are increased from prior radiograph. Biapical pleuroparenchymal scarring. Upper Abdomen: Right lobe of liver 24 mm cyst and subcentimeter lucency in the dome a right liver, too small to characterize, but likely additional cysts. Musculoskeletal: No chest wall mass or suspicious bone lesions identified. IMPRESSION: Left lower lobe pneumonia and small left pleural effusion increased from prior radiographs. Electronically Signed   By: Kristine Garbe M.D.   On: 07/20/2017 14:47   Ct Angio Chest Pe W And/or Wo Contrast  Result Date: 07/21/2017 CLINICAL DATA:  Left-sided chest tightness.  Cough and headache. EXAM: CT ANGIOGRAPHY CHEST WITH CONTRAST TECHNIQUE: Multidetector CT imaging of the chest was performed using the standard protocol during bolus administration of intravenous contrast. Multiplanar CT image reconstructions and MIPs were obtained to evaluate the vascular anatomy. CONTRAST:  31mL ISOVUE-370 IOPAMIDOL (ISOVUE-370) INJECTION 76% COMPARISON:  Noncontrast chest CT performed yesterday. FINDINGS: Cardiovascular: There are  no filling defects within the pulmonary arteries to suggest pulmonary embolus. Normal caliber thoracic aorta. Heart is normal in size. No pericardial effusion. Mediastinum/Nodes: Prominent left hilar node measures 10 mm. Enlarged left paratracheal node measures 12 mm. No right hilar adenopathy. The esophagus is decompressed. Visualized thyroid gland is normal. Lungs/Pleura: Ground-glass and consolidative opacities within the left lower lobe as seen on CT yesterday. Small-moderate left pleural effusion slightly increased, adjacent atelectasis in the dependent left upper lobe. Again seen biapical pleuroparenchymal scarring. No pulmonary edema. No right pleural effusion. Fissural thickening of the minor fissure on the right likely an intrapulmonary lymph node. Upper Abdomen: Subcapsular 2.4 cm hypodense lesion in the right lobe of the liver, likely cyst. No acute finding. Musculoskeletal: There are no acute or suspicious osseous abnormalities. Review of the MIP images confirms the above findings. IMPRESSION: 1. No pulmonary embolus. 2. Ground-glass consolidative opacity in the left lower lobe likely pneumonia, unchanged in CT yesterday. Small to moderate left pleural effusion has slightly increased in the interim, with adjacent compressive atelectasis in the dependent left upper lobe. Recommend radiographic follow-up to resolution after course of treatment in 3-4 weeks. Electronically Signed   By: Jeb Levering M.D.   On: 07/21/2017 01:55     Assessment/Plan Principal Problem:   CAP (community acquired pneumonia) Active Problems:   HLD (hyperlipidemia)    1. Community-acquired pneumonia with left-sided moderate pleural effusion -patient has been placed on vancomycin and cefepime and will add Zithromax.  Check blood cultures urine for Legionella and strep antigen and influenza PCR.  If patient continues to spike fever will need left-sided thoracentesis. 2. Hyperlipidemia on Zetia.   DVT prophylaxis:  SCDs in anticipation of possible procedure. Code Status: Full code. Family Communication: Discussed with patient. Disposition Plan: Home. Consults called: None. Admission status: Observation.   Rise Patience MD [facility] Pager [phone].  If 7PM-7AM, please contact night-coverage [email or web address] Password [email or web address]  07/21/2017, 6:01 AM

## 2017-07-22 DIAGNOSIS — J181 Lobar pneumonia, unspecified organism: Secondary | ICD-10-CM

## 2017-07-22 DIAGNOSIS — E785 Hyperlipidemia, unspecified: Secondary | ICD-10-CM

## 2017-07-22 LAB — CBC
HCT: 35.8 % — ABNORMAL LOW (ref 36.0–46.0)
Hemoglobin: 11.8 g/dL — ABNORMAL LOW (ref 12.0–15.0)
MCH: 30 pg (ref 26.0–34.0)
MCHC: 33 g/dL (ref 30.0–36.0)
MCV: 91.1 fL (ref 78.0–100.0)
PLATELETS: 435 10*3/uL — AB (ref 150–400)
RBC: 3.93 MIL/uL (ref 3.87–5.11)
RDW: 13.6 % (ref 11.5–15.5)
WBC: 9.4 10*3/uL (ref 4.0–10.5)

## 2017-07-22 LAB — BASIC METABOLIC PANEL
Anion gap: 8 (ref 5–15)
BUN: 10 mg/dL (ref 6–20)
CALCIUM: 9 mg/dL (ref 8.9–10.3)
CO2: 23 mmol/L (ref 22–32)
CREATININE: 0.72 mg/dL (ref 0.44–1.00)
Chloride: 105 mmol/L (ref 101–111)
GFR calc non Af Amer: >60 mL/min (ref >60)
Glucose, Bld: 104 mg/dL — ABNORMAL HIGH (ref 65–99)
Potassium: 4 mmol/L (ref 3.5–5.1)
SODIUM: 136 mmol/L (ref 135–145)

## 2017-07-22 LAB — PROCALCITONIN

## 2017-07-22 MED ORDER — PNEUMOCOCCAL VAC POLYVALENT 25 MCG/0.5ML IJ INJ
0.5000 mL | INJECTION | INTRAMUSCULAR | Status: AC
  Administered 2017-07-22: 0.5 mL via INTRAMUSCULAR
  Filled 2017-07-22: qty 0.5

## 2017-07-22 MED ORDER — PNEUMOCOCCAL VAC POLYVALENT 25 MCG/0.5ML IJ INJ: 0.5000 mL | INJECTION | INTRAMUSCULAR | Status: DC

## 2017-07-22 NOTE — Progress Notes (Signed)
Reviewed discharge instructions with patient. Answered her questions. Pt is stable and ready for discharge. Waiting on her husband.

## 2017-07-22 NOTE — Discharge Summary (Addendum)
Physician Discharge Summary  Cheyenne Hanson LFY:[id number] DOB: 1956/06/23  PCP: Alycia Rossetti, MD  Admit date: 07/20/2017 Discharge date: 07/22/2017  Recommendations for Outpatient Follow-up:  1. Dr. Idamae Lusher, PCP on 08/02/17 at 8:30 AM. To be seen with repeat labs (CBC & CMP). 2. Recommend repeating chest x-ray in 3-4 weeks to ensure resolution of pneumonia and pleural effusion findings. May consider repeating noncontrast chest CT if needed.  Home Health: None Equipment/Devices: None    Discharge Condition: Improved and stable  CODE STATUS: Full  Diet recommendation: Heart healthy diet.  Discharge Diagnoses:  Principal Problem:   CAP (community acquired pneumonia) Active Problems:   HLD (hyperlipidemia)   Brief Summary: 61 year old female with PMH of HLD, anxiety, OSA on nightly CPAP, presented to ED with approximately 10 day history of persistent cough, fever, chills despite completing a course of azithromycin and 3 days of levofloxacin for outpatient diagnosis of pneumonia. PCP ordered CT chest which showed left lower lobe pneumonia and pleural effusion and was advised to come to ED. No sick contacts.   Assessment & Plan:  1. Lobar pneumonia (left lower lobe)/community-acquired pneumonia: Failed outpatient oral antibiotics (azithromycin >levofloxacin). Empirically started on IV vancomycin, cefepime and azithromycin. Urine streptococcal and legionella antigen were negative. Patient unable to provide sputum, mostly dry cough at this time. Blood cultures negative to date. HIV screen nonreactive. Influenza panel PCR negative. Pro-calcitonin <0.1 2 days . Effusion is probably parapneumonic. Will need follow-up chest x-ray in 4 weeks to ensure resolution of pneumonia findings. CTA chest negative for PE. On day of discharge, patient doing quite well. I discussed with infectious disease M.D. on call regarding need and choice of further antibiotics. It was recommended to  complete remainder of the course of levofloxacin that patient had at home. This was discussed in detail with patient and spouse and they verbalized understanding. They were advised to seek immediate medical attention if there was any worsening of symptoms. 2. Hyperlipidemia: Continue Zetia 3. OSA: Continue nightly CPAP. 4. Anemia: Stable. 5. Mild transaminitis: Unclear etiology.? Related to problem #1. Outpatient follow-up.    Consultants:  None   Procedures:  None   Discharge Instructions  Discharge Instructions    Call MD for:  difficulty breathing, headache or visual disturbances   Complete by:  As directed    Call MD for:  extreme fatigue   Complete by:  As directed    Call MD for:  persistant dizziness or light-headedness   Complete by:  As directed    Call MD for:  severe uncontrolled pain   Complete by:  As directed    Call MD for:  temperature >100.4   Complete by:  As directed    Diet - low sodium heart healthy   Complete by:  As directed    Increase activity slowly   Complete by:  As directed        Medication List    TAKE these medications   azelastine 0.1 % nasal spray Commonly known as:  ASTELIN Place 2 sprays into both nostrils 2 (two) times daily. Use in each nostril as directed   clonazePAM 0.5 MG tablet Commonly known as:  KLONOPIN Take 0.5 mg by mouth at bedtime as needed for anxiety.   ezetimibe 10 MG tablet Commonly known as:  ZETIA Take 10 mg by mouth every evening.   guaiFENesin-codeine 100-10 MG/5ML syrup Take 5 mLs by mouth every 6 (six) hours as needed.   ibuprofen 200 MG tablet Commonly known  as:  ADVIL,MOTRIN Take 600 mg by mouth every 6 (six) hours as needed for headache or moderate pain.   levofloxacin 750 MG tablet Commonly known as:  LEVAQUIN Take 1 tablet (750 mg total) by mouth daily.   loratadine 10 MG tablet Commonly known as:  CLARITIN Take 10 mg by mouth daily.   MUCINEX 600 MG 12 hr tablet Generic drug:   guaiFENesin Take 600 mg by mouth 4 (four) times daily.      Follow-up Information    [place], Modena Nunnery, MD. Schedule an appointment as soon as possible for a visit in 1 week(s).   Specialty:  Family Medicine Why:  To be seen with repeat labs (CBC & CMP). Contact information: [address] [place] [address] [phone]          Allergies  Allergen Reactions  . Latex Other (See Comments)    Blisters if she wears the gloves  . Statins     Muscle aches      Procedures/Studies: Dg Chest 2 View  Result Date: 07/13/2017 CLINICAL DATA:  Low-grade fever.  Left-sided chest and rib pain. EXAM: CHEST  2 VIEW COMPARISON:  Two-view chest x-ray 09/13/2013 FINDINGS: Superior segment left lower lobe airspace disease is present. The heart size is normal. Lungs are otherwise clear. The visualized soft tissues and bony thorax are otherwise normal. IMPRESSION: 1. Superior segment left lower lobe pneumonia. Electronically Signed   By: San Morelle M.D.   On: 07/13/2017 17:37   Ct Chest Wo Contrast  Result Date: 07/20/2017 CLINICAL DATA:  61 y/o F; left chest pain, cough and fever with pneumonia. EXAM: CT CHEST WITHOUT CONTRAST TECHNIQUE: Multidetector CT imaging of the chest was performed following the standard protocol without IV contrast. COMPARISON:  07/13/2017 chest radiograph FINDINGS: Cardiovascular: No significant vascular findings. Normal heart size. No pericardial effusion. Mediastinum/Nodes: Left lower paratracheal lymphadenopathy, likely reactive. Normal thyroid gland. Normal thoracic esophagus. Lungs/Pleura: Left lower lobe consolidation and small left effusion are increased from prior radiograph. Biapical pleuroparenchymal scarring. Upper Abdomen: Right lobe of liver 24 mm cyst and subcentimeter lucency in the dome a right liver, too small to characterize, but likely additional cysts. Musculoskeletal: No chest wall mass or suspicious bone lesions identified. IMPRESSION:  Left lower lobe pneumonia and small left pleural effusion increased from prior radiographs. Electronically Signed   By: Kristine Garbe M.D.   On: 07/20/2017 14:47   Ct Angio Chest Pe W And/or Wo Contrast  Result Date: 07/21/2017 CLINICAL DATA:  Left-sided chest tightness.  Cough and headache. EXAM: CT ANGIOGRAPHY CHEST WITH CONTRAST TECHNIQUE: Multidetector CT imaging of the chest was performed using the standard protocol during bolus administration of intravenous contrast. Multiplanar CT image reconstructions and MIPs were obtained to evaluate the vascular anatomy. CONTRAST:  19mL ISOVUE-370 IOPAMIDOL (ISOVUE-370) INJECTION 76% COMPARISON:  Noncontrast chest CT performed yesterday. FINDINGS: Cardiovascular: There are no filling defects within the pulmonary arteries to suggest pulmonary embolus. Normal caliber thoracic aorta. Heart is normal in size. No pericardial effusion. Mediastinum/Nodes: Prominent left hilar node measures 10 mm. Enlarged left paratracheal node measures 12 mm. No right hilar adenopathy. The esophagus is decompressed. Visualized thyroid gland is normal. Lungs/Pleura: Ground-glass and consolidative opacities within the left lower lobe as seen on CT yesterday. Small-moderate left pleural effusion slightly increased, adjacent atelectasis in the dependent left upper lobe. Again seen biapical pleuroparenchymal scarring. No pulmonary edema. No right pleural effusion. Fissural thickening of the minor fissure on the right likely an intrapulmonary  lymph node. Upper Abdomen: Subcapsular 2.4 cm hypodense lesion in the right lobe of the liver, likely cyst. No acute finding. Musculoskeletal: There are no acute or suspicious osseous abnormalities. Review of the MIP images confirms the above findings. IMPRESSION: 1. No pulmonary embolus. 2. Ground-glass consolidative opacity in the left lower lobe likely pneumonia, unchanged in CT yesterday. Small to moderate left pleural effusion has slightly  increased in the interim, with adjacent compressive atelectasis in the dependent left upper lobe. Recommend radiographic follow-up to resolution after course of treatment in 3-4 weeks. Electronically Signed   By: Jeb Levering M.D.   On: 07/21/2017 01:55      Subjective: States that she feels much better. Denies dyspnea or chest pain even with ambulation. Has been on room air. No other complaints reported.  Discharge Exam:  Vitals:   07/21/17 2043 07/22/17 0136 07/22/17 0450 07/22/17 0830  BP: 132/79 124/78 123/69 120/65  Pulse: 78 73 82 88  Resp: 18 16 18 17   Temp: 98.6 F (37 C) 99.2 F (37.3 C) 98.6 F (37 C) 98 F (36.7 C)  TempSrc: Oral Oral Oral Oral  SpO2: 98% 95% 100% 96%  Weight: 79.8 kg (176 lb)  78.1 kg (172 lb 3.2 oz)   Height: 5\' 10"  (1.778 m)       General exam: Pleasant middle-aged female, moderately built and nourished, lying comfortably propped up in bed.  Respiratory system:  slightly diminished breath sounds in the left base with occasional crackles. Rest of lung fields clear to auscultation. Respiratory effort normal. Cardiovascular system: S1 & S2 heard, RRR. No JVD, murmurs, rubs, gallops or clicks. No pedal edema. Telemetry: Sinus rhythm. Gastrointestinal system: Abdomen is nondistended, soft and nontender. No organomegaly or masses felt. Normal bowel sounds heard. Central nervous system: Alert and oriented. No focal neurological deficits. Extremities: Symmetric 5 x 5 power. Skin: No rashes, lesions or ulcers Psychiatry: Judgement and insight appear normal. Mood & affect appropriate.       The results of significant diagnostics from this hospitalization (including imaging, microbiology, ancillary and laboratory) are listed below for reference.     Labs: CBC: Recent Labs  Lab 07/18/17 1538 07/20/17 2132 07/22/17 0509  WBC 11.3* 9.5 9.4  NEUTROABS  --  6.9  --   HGB 11.5* 11.6* 11.8*  HCT 34.6* 35.5* 35.8*  MCV 93.5 90.6 91.1  PLT 347 428*  476*   Basic Metabolic Panel: Recent Labs  Lab 07/18/17 1538 07/20/17 2132 07/22/17 0509  NA 141 137 136  K 3.6 3.5 4.0  CL 105 104 105  CO2 21 23 23   GLUCOSE 100* 107* 104*  BUN 9 15 10   CREATININE 0.70 0.83 0.72  CALCIUM 8.3* 8.7* 9.0   Liver Function Tests: Recent Labs  Lab 07/18/17 1538 07/20/17 2132  AST 31 47*  ALT 48* 61*  ALKPHOS  --  86  BILITOT 0.4 0.1*  PROT 6.0* 6.6  ALBUMIN  --  2.6*   BNP (last 3 results) Recent Labs    07/20/17 2340  BNP 75.9   Cardiac Enzymes: Recent Labs  Lab 07/20/17 2340  TROPONINI <0.03   Discussed in detail with patient's spouse at bedside. Answered all questions and updated care.   Time coordinating discharge: Less than 30 minutes  SIGNED:  Vernell Leep, MD, FACP, [place]. [facility] Pager [phone]  If 7PM-7AM, please contact night-coverage [email or web address] Password [email or web address] 07/22/2017, 11:57 AM

## 2017-07-22 NOTE — Discharge Instructions (Signed)

## 2017-07-23 LAB — LEGIONELLA PNEUMOPHILA SEROGP 1 UR AG: L. pneumophila Serogp 1 Ur Ag: NEGATIVE

## 2017-07-26 LAB — CULTURE, BLOOD (ROUTINE X 2)
Culture: NO GROWTH
Culture: NO GROWTH
SPECIAL REQUESTS: ADEQUATE
Special Requests: ADEQUATE

## 2017-08-02 ENCOUNTER — Other Ambulatory Visit: Payer: BLUE CROSS/BLUE SHIELD

## 2017-08-02 ENCOUNTER — Encounter: Payer: BLUE CROSS/BLUE SHIELD | Admitting: Family Medicine

## 2017-08-02 ENCOUNTER — Other Ambulatory Visit: Payer: Self-pay

## 2017-08-12 ENCOUNTER — Other Ambulatory Visit: Payer: BLUE CROSS/BLUE SHIELD

## 2017-08-12 ENCOUNTER — Ambulatory Visit (INDEPENDENT_AMBULATORY_CARE_PROVIDER_SITE_OTHER): Payer: BLUE CROSS/BLUE SHIELD | Admitting: Family Medicine

## 2017-08-12 ENCOUNTER — Encounter: Payer: Self-pay | Admitting: Family Medicine

## 2017-08-12 VITALS — BP 118/70 | HR 78 | Temp 98.0°F | Resp 14 | Ht 69.0 in | Wt 173.0 lb

## 2017-08-12 DIAGNOSIS — Z09 Encounter for follow-up examination after completed treatment for conditions other than malignant neoplasm: Secondary | ICD-10-CM

## 2017-08-12 DIAGNOSIS — R945 Abnormal results of liver function studies: Principal | ICD-10-CM

## 2017-08-12 DIAGNOSIS — R7989 Other specified abnormal findings of blood chemistry: Secondary | ICD-10-CM

## 2017-08-12 LAB — COMPREHENSIVE METABOLIC PANEL
AG Ratio: 1.8 (calc) (ref 1.0–2.5)
ALBUMIN MSPROF: 4.1 g/dL (ref 3.6–5.1)
ALT: 20 U/L (ref 6–29)
AST: 19 U/L (ref 10–35)
Alkaline phosphatase (APISO): 40 U/L (ref 33–130)
BILIRUBIN TOTAL: 0.5 mg/dL (ref 0.2–1.2)
BUN: 16 mg/dL (ref 7–25)
CALCIUM: 9.4 mg/dL (ref 8.6–10.4)
CO2: 28 mmol/L (ref 20–32)
Chloride: 103 mmol/L (ref 98–110)
Creat: 0.73 mg/dL (ref 0.50–0.99)
Globulin: 2.3 g/dL (calc) (ref 1.9–3.7)
Glucose, Bld: 98 mg/dL (ref 65–99)
POTASSIUM: 4.3 mmol/L (ref 3.5–5.3)
SODIUM: 138 mmol/L (ref 135–146)
TOTAL PROTEIN: 6.4 g/dL (ref 6.1–8.1)

## 2017-08-12 LAB — EXTRA LAV TOP TUBE

## 2017-08-12 MED ORDER — AZELASTINE HCL 0.1 % NA SOLN: 2.0000 | Freq: Two times a day (BID) | NASAL | 0 refills | Status: DC

## 2017-08-12 NOTE — Progress Notes (Signed)
Subjective:    Patient ID: Cheyenne Hanson, female    DOB: [birth year], 61 y.o.   MRN: [id number]  HPI Patient was recently admitted to the hospital at the end of November with community-acquired pneumonia in the left lower lobe. She also has a small left pleural effusion. This was originally seen on chest x-ray. She failed outpatient antibiotics with Zithromax and Levaquin. She was admitted to the hospital and treated with IV antibiotics. Her symptoms slowly started to improve. Patient now feels back to her baseline. She's been off antibiotics now for 3 weeks. She denies any chest pain. She denies any shortness of breath. She denies any pleurisy. She does have some mild right-sided mid back pain but this seems muscular in nature. She denies any hemoptysis or weight loss. I reviewed the discharge summary from the hospital along with a CT scan and a CT angiogram of the chest. Patient is due for a follow-up chest x-ray in one week which would be 4 weeks after antibiotics  Past Medical History:  Diagnosis Date  . Anemia   . Anxiety    uses klonopin sparingly (30 lasts a year)  . Basal cell carcinoma    "face, back, chest" (07/21/2017)  . CAP (community acquired pneumonia) 07/20/2017  . GERD (gastroesophageal reflux disease)   . Headache    "maybe weekly; more w/seasonal allergies" (07/21/2017)  . Hyperlipidemia   . Mitral valve prolapse   . OSA on CPAP    cpap since 2008  . Pneumonia 1990s X 1  . Seasonal allergies    Past Surgical History:  Procedure Laterality Date  . ANTERIOR CERVICAL DECOMP/DISCECTOMY FUSION  ?2010    Dr. Ronnald Ramp  . ARTHROSCOPIC REPAIR ACL Left 1997  . BACK SURGERY    . BASAL CELL CARCINOMA EXCISION     "face, back, chest" (07/21/2017)   Current Outpatient Medications on File Prior to Visit  Medication Sig Dispense Refill  . clonazePAM (KLONOPIN) 0.5 MG tablet Take 0.5 mg by mouth at bedtime as needed for anxiety.     Ship broker Ac-Sulfur (PAZOL XS EX)  Apply topically.    Marland Kitchen ezetimibe (ZETIA) 10 MG tablet Take 10 mg by mouth every evening.     Marland Kitchen guaiFENesin (MUCINEX) 600 MG 12 hr tablet Take 600 mg by mouth 4 (four) times daily.     Marland Kitchen ibuprofen (ADVIL,MOTRIN) 200 MG tablet Take 600 mg by mouth every 6 (six) hours as needed for headache or moderate pain.    Marland Kitchen loratadine (CLARITIN) 10 MG tablet Take 10 mg by mouth daily.     No current facility-administered medications on file prior to visit.    Allergies  Allergen Reactions  . Latex Other (See Comments)    Blisters if she wears the gloves  . Statins     Muscle aches   Social History   Socioeconomic History  . Marital status: Married    Spouse name: Not on file  . Number of children: Not on file  . Years of education: Not on file  . Highest education level: Not on file  Social Needs  . Financial resource strain: Not on file  . Food insecurity - worry: Not on file  . Food insecurity - inability: Not on file  . Transportation needs - medical: Not on file  . Transportation needs - non-medical: Not on file  Occupational History  . Not on file  Tobacco Use  . Smoking status: Never Smoker  . Smokeless tobacco: Never  Used  Substance and Sexual Activity  . Alcohol use: Yes    Alcohol/week: 1.2 oz    Types: 2 Glasses of wine per week    Frequency: Never  . Drug use: No  . Sexual activity: Not Currently  Other Topics Concern  . Not on file  Social History Narrative  . Not on file    Review of Systems  All other systems reviewed and are negative.      Objective:   Physical Exam  Constitutional: She appears well-developed and well-nourished. No distress.  Cardiovascular: Normal rate, regular rhythm and normal heart sounds.  Pulmonary/Chest: Effort normal. She has rales.  Abdominal: Soft. Bowel sounds are normal. She exhibits no distension. There is no tenderness. There is no rebound and no guarding.  Skin: She is not diaphoretic.  Vitals reviewed.           Assessment & Plan:  Hospital discharge follow-up  Patient continues to have some mild Rales in the left lower lobe posteriorly. I've asked her to get a chest x-ray to ensure resolution of her left lower lobe pneumonia. Chest x-ray shows persistence, I would repeat CT scan to evaluate for obstructive lesion causing lobar pneumonia

## 2017-08-17 ENCOUNTER — Ambulatory Visit
Admission: RE | Admit: 2017-08-17 | Discharge: 2017-08-17 | Disposition: A | Discharge status: Home / Self Care | Payer: BLUE CROSS/BLUE SHIELD | Source: Ambulatory Visit | Attending: Family Medicine | Admitting: Family Medicine

## 2017-08-17 DIAGNOSIS — Z09 Encounter for follow-up examination after completed treatment for conditions other than malignant neoplasm: Secondary | ICD-10-CM

## 2017-08-18 ENCOUNTER — Other Ambulatory Visit: Payer: Self-pay | Admitting: Family Medicine

## 2017-08-18 ENCOUNTER — Encounter: Payer: Self-pay | Admitting: Family Medicine

## 2017-08-18 DIAGNOSIS — J984 Other disorders of lung: Secondary | ICD-10-CM

## 2017-08-18 DIAGNOSIS — R0989 Other specified symptoms and signs involving the circulatory and respiratory systems: Secondary | ICD-10-CM

## 2017-08-24 ENCOUNTER — Other Ambulatory Visit: Payer: Self-pay | Admitting: Family Medicine

## 2017-08-26 ENCOUNTER — Other Ambulatory Visit: Payer: BLUE CROSS/BLUE SHIELD

## 2017-08-26 ENCOUNTER — Ambulatory Visit
Admission: RE | Admit: 2017-08-26 | Discharge: 2017-08-26 | Disposition: A | Discharge status: Home / Self Care | Payer: BLUE CROSS/BLUE SHIELD | Source: Ambulatory Visit | Attending: Family Medicine | Admitting: Family Medicine

## 2017-08-26 DIAGNOSIS — J984 Other disorders of lung: Secondary | ICD-10-CM

## 2017-08-26 DIAGNOSIS — R0989 Other specified symptoms and signs involving the circulatory and respiratory systems: Secondary | ICD-10-CM

## 2017-08-29 ENCOUNTER — Encounter: Payer: Self-pay | Admitting: Family Medicine

## 2017-08-30 ENCOUNTER — Other Ambulatory Visit: Payer: BLUE CROSS/BLUE SHIELD

## 2017-09-06 NOTE — Progress Notes (Signed)
[facility] Note  09/07/2017     CHIEF COMPLAINT Patient presents for Retina Evaluation   HISTORY OF PRESENT ILLNESS: Cheyenne Hanson is a 62 y.o. female who presents to the clinic today for:   HPI    Retina Evaluation    In left eye.  Duration of 1 month.  Associated Symptoms Flashes and Floaters.  Negative for Blind Spot, Photophobia, Scalp Tenderness, Fever, Pain, Glare, Jaw Claudication, Weight Loss, Distortion, Redness, Trauma, Shoulder/Hip pain and Fatigue.  Treatments tried include no treatments.  I, the attending physician,  performed the HPI with the patient and updated documentation appropriately.          Comments    Pt presents today on the referral of Dr. Shirley Muscat for possible retinal hole OS, pt states vision has been stable, pt states she has floaters OS, and flashes of light OU, but only in low light, pt denies pain or wavy vision, but states OS sometimes feels like it has a different pressure than OD, pt uses Pazeo gtts for allergies and Alcon Tears Naturale Free and Systane Ultra PRN OU for dryness, pt is not diabetic,        Last edited by Bernarda Caffey, MD on 09/07/2017  8:27 AM. (History)    Pt states she saw Dr. 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 Renaldo Harrison for concern of retinal hole OS  LEE- 12.21.18 (S. Bernstorf) [BCVA OD: 20/20 OS: 20/50] Ocular Hx- ocular migraine  PMH- elevated cholesterol, migraines     CURRENT MEDICATIONS: Current Outpatient Medications (Ophthalmic Drugs)  Medication Sig  . PAZEO 0.7 % SOLN INSTILL ONE DROP IN [facility] EYE ONCE DAILY IN THE EVENING   No current  facility-administered medications for this visit.  (Ophthalmic Drugs)   Current Outpatient Medications (Other)  Medication Sig  . azelastine (ASTELIN) 0.1 % nasal spray Place 2 sprays into both nostrils 2 (two) times daily.  . clonazePAM (KLONOPIN) 0.5 MG tablet Take 0.5 mg by mouth at bedtime as needed for anxiety.   Ship broker Ac-Sulfur (PAZOL XS EX) Apply topically.  Marland Kitchen ezetimibe (ZETIA) 10 MG tablet Take 10 mg by mouth every evening.   Marland Kitchen guaiFENesin (MUCINEX) 600 MG 12 hr tablet Take 600 mg by mouth 4 (four) times daily.   Marland Kitchen ibuprofen (ADVIL,MOTRIN) 200 MG tablet Take 600 mg by mouth every 6 (six) hours as needed for headache or moderate pain.  Marland Kitchen loratadine (CLARITIN) 10 MG tablet Take 10 mg by mouth daily.   No current facility-administered medications for this visit.  (Other)      REVIEW OF SYSTEMS:    ALLERGIES Allergies  Allergen Reactions  . Latex Other (See Comments)    Blisters if she wears the gloves  . Statins     Muscle aches    PAST MEDICAL HISTORY Past Medical History:  Diagnosis Date  . Anemia   . Anxiety    uses klonopin sparingly (30 lasts a year)  . Basal cell carcinoma    "face, back, chest" (07/21/2017)  . CAP (community acquired pneumonia) 07/20/2017  . GERD (gastroesophageal reflux disease)   . Headache    "  maybe weekly; more w/seasonal allergies" (07/21/2017)  . Hyperlipidemia   . Mitral valve prolapse   . OSA on CPAP    cpap since 2008  . Pneumonia 1990s X 1  . Seasonal allergies    Past Surgical History:  Procedure Laterality Date  . ANTERIOR CERVICAL DECOMP/DISCECTOMY FUSION  ?2010    Dr. Ronnald Ramp  . ARTHROSCOPIC REPAIR ACL Left 1997  . BACK SURGERY    . BASAL CELL CARCINOMA EXCISION     "face, back, chest" (07/21/2017)    FAMILY HISTORY Family History  Problem Relation Age of Onset  . Diabetes Mother   . Cancer Father        lung  . Stroke Sister   . Hyperlipidemia Sister   . Diabetes Sister   . Heart disease  Brother   . Hyperlipidemia Brother   . Breast cancer Cousin   . Diabetes Cousin   . Diabetes Maternal Grandmother   . Amblyopia Neg Hx   . Blindness Neg Hx   . Cataracts Neg Hx   . Glaucoma Neg Hx   . Macular degeneration Neg Hx   . Retinal detachment Neg Hx   . Strabismus Neg Hx   . Retinitis pigmentosa Neg Hx     SOCIAL HISTORY Social History   Tobacco Use  . Smoking status: Never Smoker  . Smokeless tobacco: Never Used  Substance Use Topics  . Alcohol use: Yes    Alcohol/week: 1.2 oz    Types: 2 Glasses of wine per week    Frequency: Never  . Drug use: No         OPHTHALMIC EXAM:  Base Eye Exam    Visual Acuity (Snellen - Linear)      Right Left   Dist cc 20/20 20/20   Correction:  Glasses       Tonometry (Tonopen, 8:27 AM)      Right Left   Pressure 15 16       Pupils      Dark Light Shape React APD   Right 5 4 Round Brisk None   Left 5 4 Round Brisk None       Visual Fields (Counting fingers)      Left Right    Full Full       Extraocular Movement      Right Left    Full, Ortho Full, Ortho       Neuro/Psych    Oriented x3:  Yes   Mood/Affect:  Normal       Dilation    Both eyes:  1.0% Mydriacyl, 2.5% Phenylephrine @ 8:27 AM        Slit Lamp and Fundus Exam    Slit Lamp Exam      Right Left   Lids/Lashes Dermatochalasis - upper lid Dermatochalasis - upper lid   Conjunctiva/Sclera White and quiet White and quiet   Cornea 2+ Punctate epithelial erosions, dry tear film Inferior 3+ Punctate epithelial erosions, dry tear film   Anterior Chamber Deep and quiet Deep and quiet   Iris Round and dilated Round and dilated   Lens 2+ Nuclear sclerosis, 2+ Cortical cataract 2+ Nuclear sclerosis, 2+ Cortical cataract   Vitreous Vitreous syneresis, Posterior vitreous detachment Vitreous syneresis, Posterior vitreous detachment       Fundus Exam      Right Left   Disc Normal Normal   C/D Ratio 0.3 0.45   Macula Flat, No heme or edema Flat, No  heme or edema, mild Retinal  pigment epithelial mottling   Vessels Normal Normal   Periphery Attached, very mild scattered reticular changes Attached, small round area of chorioretinal atrophy at 0500 mid-zone, mild scattered reticular changes          IMAGING AND PROCEDURES  Imaging and Procedures for 09/07/17  OCT, Retina - OU - Both Eyes     Right Eye Quality was good. Central Foveal Thickness: 304. Progression has no prior data. Findings include normal foveal contour, no IRF, no SRF.   Left Eye Quality was good. Central Foveal Thickness: 300. Progression has no prior data. Findings include normal foveal contour, no IRF, no SRF.   Notes *Images captured and stored on drive  Diagnosis / Impression:  NFP; no IRF/SRF OU  Clinical management:  See below  Abbreviations: NFP - Normal foveal profile. CME - cystoid macular edema. PED - pigment epithelial detachment. IRF - intraretinal fluid. SRF - subretinal fluid. EZ - ellipsoid zone. ERM - epiretinal membrane. ORA - outer retinal atrophy. ORT - outer retinal tubulation. SRHM - subretinal hyper-reflective material                 ASSESSMENT/PLAN:    ICD-10-CM   1. 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 Retinal edema H35.81 OCT, Retina - OU - Both Eyes  3. Posterior vitreous detachment of both eyes H43.813   4. 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 Chorioretinal atrophy OS  Round focal area of chorioretinal atrophy, 5 oclock midzone  No retinal tear, subretinal fluid or RD  Monitor  F/u in 6 mos, sooner prn  2. No retinal edema on exam or OCT  3. PVD / vitreous syneresis OU-   Discussed findings and prognosis  No RT or RD on 360 peripheral exam  Reviewed s/s of RT/RD  Strict return precautions for any such RT/RD signs/symptoms  4. Combined form of age-related cataract OU-  - The symptoms of cataract, surgical options, and treatments and risks were discussed with patient. -  discussed diagnosis and progression - not yet visually significant - monitor for now   Ophthalmic Meds Ordered this visit:  No orders of the defined types were placed in this encounter.     Return in about 6 months (around 03/07/2018) for F/U Chorioretinal atrophy OS.  There are no Patient Instructions on file for this visit.   Explained the diagnoses, plan, and follow up with the patient and they expressed understanding.  Patient expressed understanding of the importance of proper follow up care.   This document serves as a record of services personally performed by Gardiner Sleeper, MD, PhD. It was created on their behalf by Catha Brow, [place], a certified ophthalmic assistant. The creation of this record is the provider's dictation and/or activities during the visit.  Electronically signed by: Catha Brow, COA  09/07/17 10:53 AM    Gardiner Sleeper, M.D., Ph.D. Diseases & Surgery of the Retina and [place] 09/07/17  I have reviewed the above documentation for accuracy and completeness, and I agree with the above. Gardiner Sleeper, M.D., Ph.D. 09/07/17 10:58 AM     Abbreviations: M myopia (nearsighted); A astigmatism; H hyperopia (farsighted); P presbyopia; Mrx spectacle prescription;  CTL contact lenses; OD right eye; OS left eye; OU both eyes  XT exotropia; ET esotropia; PEK punctate epithelial keratitis; PEE punctate epithelial erosions; DES dry eye syndrome; MGD meibomian gland dysfunction; ATs artificial tears; PFAT's preservative free  artificial tears; [place] nuclear sclerotic cataract; PSC posterior subcapsular cataract; ERM epi-retinal membrane; PVD posterior vitreous detachment; RD retinal detachment; DM diabetes mellitus; DR diabetic retinopathy; NPDR non-proliferative diabetic retinopathy; PDR proliferative diabetic retinopathy; CSME clinically significant macular edema; DME diabetic macular edema; dbh dot blot hemorrhages; CWS cotton  wool spot; POAG primary open angle glaucoma; C/D cup-to-disc ratio; HVF humphrey visual field; GVF goldmann visual field; OCT optical coherence tomography; IOP intraocular pressure; BRVO Branch retinal vein occlusion; CRVO central retinal vein occlusion; CRAO central retinal artery occlusion; BRAO branch retinal artery occlusion; RT retinal tear; SB scleral buckle; PPV pars plana vitrectomy; VH Vitreous hemorrhage; PRP panretinal laser photocoagulation; IVK intravitreal kenalog; VMT vitreomacular traction; MH Macular hole;  NVD neovascularization of the disc; NVE neovascularization elsewhere; AREDS age related eye disease study; ARMD age related macular degeneration; POAG primary open angle glaucoma; EBMD epithelial/anterior basement membrane dystrophy; ACIOL anterior chamber intraocular lens; IOL intraocular lens; PCIOL posterior chamber intraocular lens; Phaco/IOL phacoemulsification with intraocular lens placement; [place] photorefractive keratectomy; LASIK laser assisted in situ keratomileusis; HTN hypertension; DM diabetes mellitus; COPD chronic obstructive pulmonary disease

## 2017-09-07 ENCOUNTER — Encounter (INDEPENDENT_AMBULATORY_CARE_PROVIDER_SITE_OTHER): Payer: Self-pay | Admitting: Ophthalmology

## 2017-09-07 ENCOUNTER — Ambulatory Visit (INDEPENDENT_AMBULATORY_CARE_PROVIDER_SITE_OTHER): Payer: BLUE CROSS/BLUE SHIELD | Admitting: Ophthalmology

## 2017-09-07 DIAGNOSIS — H25813 Combined forms of age-related cataract, bilateral: Secondary | ICD-10-CM

## 2017-09-07 DIAGNOSIS — H43813 Vitreous degeneration, bilateral: Secondary | ICD-10-CM

## 2017-09-07 DIAGNOSIS — H31102 Choroidal degeneration, unspecified, left eye: Secondary | ICD-10-CM | POA: Diagnosis not present

## 2017-09-07 DIAGNOSIS — H3581 Retinal edema: Secondary | ICD-10-CM

## 2017-11-15 ENCOUNTER — Encounter: Payer: Self-pay | Admitting: Family Medicine

## 2017-11-15 ENCOUNTER — Ambulatory Visit: Payer: BLUE CROSS/BLUE SHIELD | Admitting: Family Medicine

## 2017-11-15 VITALS — BP 120/70 | HR 78 | Temp 98.3°F | Resp 14 | Ht 69.0 in | Wt 178.0 lb

## 2017-11-15 DIAGNOSIS — R918 Other nonspecific abnormal finding of lung field: Secondary | ICD-10-CM | POA: Diagnosis not present

## 2017-11-15 NOTE — Progress Notes (Signed)
Subjective:    Patient ID: Cheyenne Hanson, female    DOB: 1955/09/25, 62 y.o.   MRN: [id number]  HPI  08/12/17 Patient was recently admitted to the hospital at the end of November with community-acquired pneumonia in the left lower lobe. She also has a small left pleural effusion. This was originally seen on chest x-ray. She failed outpatient antibiotics with Zithromax and Levaquin. She was admitted to the hospital and treated with IV antibiotics. Her symptoms slowly started to improve. Patient now feels back to her baseline. She's been off antibiotics now for 3 weeks. She denies any chest pain. She denies any shortness of breath. She denies any pleurisy. She does have some mild right-sided mid back pain but this seems muscular in nature. She denies any hemoptysis or weight loss. I reviewed the discharge summary from the hospital along with a CT scan and a CT angiogram of the chest. Patient is due for a follow-up chest x-ray in one week which would be 4 weeks after antibiotics.  At that time, my plan was: Patient continues to have some mild rales in the left lower lobe posteriorly. I've asked her to get a chest x-ray to ensure resolution of her left lower lobe pneumonia. Chest x-ray shows persistence, I would repeat CT scan to evaluate for obstructive lesion causing lobar pneumonia  11/15/17  Minimal residual ground-glass opacities in the left lower lobe, greatly improved from the prior CT dated 08/17/2017. In the acute clinical setting, this likely represents resolving pneumonia. Follow-up in 3 months may be considered with noncontrasted chest CT to assure complete resolution.  The above report is copied from the CT that was obtained.  Patient is requesting that we repeat the CT scan to ensure resolution.  She is concerned because she has developed left-sided pleurisy again in the exact same area where she had a pneumonia previously treated symptoms been present for 1 week.  She denies any  cough.  She denies any shortness of breath.  She denies any fevers or chills.  She denies any hemoptysis.  However she does have pleurisy in that area. Past Medical History:  Diagnosis Date  . Anemia   . Anxiety    uses klonopin sparingly (30 lasts a year)  . Basal cell carcinoma    "face, back, chest" (07/21/2017)  . CAP (community acquired pneumonia) 07/20/2017  . GERD (gastroesophageal reflux disease)   . Headache    "maybe weekly; more w/seasonal allergies" (07/21/2017)  . Hyperlipidemia   . Mitral valve prolapse   . OSA on CPAP    cpap since 2008  . Pneumonia 1990s X 1  . Seasonal allergies    Past Surgical History:  Procedure Laterality Date  . ANTERIOR CERVICAL DECOMP/DISCECTOMY FUSION  ?2010    Dr. Ronnald Ramp  . ARTHROSCOPIC REPAIR ACL Left 1997  . BACK SURGERY    . BASAL CELL CARCINOMA EXCISION     "face, back, chest" (07/21/2017)   Current Outpatient Medications on File Prior to Visit  Medication Sig Dispense Refill  . azelastine (ASTELIN) 0.1 % nasal spray Place 2 sprays into both nostrils 2 (two) times daily. 30 mL 2  . clonazePAM (KLONOPIN) 0.5 MG tablet Take 0.5 mg by mouth at bedtime as needed for anxiety.     Marland Kitchen ezetimibe (ZETIA) 10 MG tablet Take 10 mg by mouth every evening.     Marland Kitchen guaiFENesin (MUCINEX) 600 MG 12 hr tablet Take 600 mg by mouth 4 (four) times daily.     Marland Kitchen  ibuprofen (ADVIL,MOTRIN) 200 MG tablet Take 600 mg by mouth every 6 (six) hours as needed for headache or moderate pain.    Marland Kitchen loratadine (CLARITIN) 10 MG tablet Take 10 mg by mouth daily.    Marland Kitchen PAZEO 0.7 % SOLN INSTILL ONE DROP IN [facility] EYE ONCE DAILY IN THE EVENING  12   No current facility-administered medications on file prior to visit.    Allergies  Allergen Reactions  . Latex Other (See Comments)    Blisters if she wears the gloves  . Statins     Muscle aches   Social History   Socioeconomic History  . Marital status: Married    Spouse name: Not on file  . Number of children: Not on  file  . Years of education: Not on file  . Highest education level: Not on file  Occupational History  . Not on file  Social Needs  . Financial resource strain: Not on file  . Food insecurity:    Worry: Not on file    Inability: Not on file  . Transportation needs:    Medical: Not on file    Non-medical: Not on file  Tobacco Use  . Smoking status: Never Smoker  . Smokeless tobacco: Never Used  Substance and Sexual Activity  . Alcohol use: Yes    Alcohol/week: 1.2 oz    Types: 2 Glasses of wine per week    Frequency: Never  . Drug use: No  . Sexual activity: Not Currently  Lifestyle  . Physical activity:    Days per week: Not on file    Minutes per session: Not on file  . Stress: Not on file  Relationships  . Social connections:    Talks on phone: Not on file    Gets together: Not on file    Attends religious service: Not on file    Active member of club or organization: Not on file    Attends meetings of clubs or organizations: Not on file    Relationship status: Not on file  . Intimate partner violence:    Fear of current or ex partner: Not on file    Emotionally abused: Not on file    Physically abused: Not on file    Forced sexual activity: Not on file  Other Topics Concern  . Not on file  Social History Narrative  . Not on file    Review of Systems  All other systems reviewed and are negative.      Objective:   Physical Exam  Constitutional: She appears well-developed and well-nourished. No distress.  Cardiovascular: Normal rate, regular rhythm and normal heart sounds.  Pulmonary/Chest: Effort normal. No respiratory distress. She has no wheezes. She has no rales. She exhibits tenderness.  Abdominal: Soft. Bowel sounds are normal. She exhibits no distension. There is no tenderness. There is no rebound and no guarding.  Skin: She is not diaphoretic.  Vitals reviewed.         Assessment & Plan:  Opacity of lung on imaging study - Plan: CT Chest W  Contrast  Proceed with a repeat CT scan of the lung to ensure resolution of the ground glass opacity seen as recommended by the radiologist in January.

## 2017-11-25 ENCOUNTER — Other Ambulatory Visit: Payer: BLUE CROSS/BLUE SHIELD

## 2017-11-25 ENCOUNTER — Ambulatory Visit
Admission: RE | Admit: 2017-11-25 | Discharge: 2017-11-25 | Disposition: A | Discharge status: Home / Self Care | Payer: BLUE CROSS/BLUE SHIELD | Source: Ambulatory Visit | Attending: Family Medicine | Admitting: Family Medicine

## 2017-11-25 DIAGNOSIS — R918 Other nonspecific abnormal finding of lung field: Secondary | ICD-10-CM

## 2017-11-28 ENCOUNTER — Encounter (INDEPENDENT_AMBULATORY_CARE_PROVIDER_SITE_OTHER): Payer: Self-pay

## 2017-11-28 ENCOUNTER — Other Ambulatory Visit: Payer: Self-pay | Admitting: Family Medicine

## 2017-11-30 ENCOUNTER — Ambulatory Visit: Payer: BLUE CROSS/BLUE SHIELD | Admitting: Family Medicine

## 2017-11-30 ENCOUNTER — Encounter: Payer: Self-pay | Admitting: Family Medicine

## 2017-11-30 VITALS — BP 118/70 | HR 88 | Temp 99.2°F | Resp 16 | Ht 69.0 in | Wt 177.0 lb

## 2017-11-30 DIAGNOSIS — J329 Chronic sinusitis, unspecified: Secondary | ICD-10-CM

## 2017-11-30 DIAGNOSIS — J310 Chronic rhinitis: Secondary | ICD-10-CM

## 2017-11-30 DIAGNOSIS — J309 Allergic rhinitis, unspecified: Secondary | ICD-10-CM | POA: Diagnosis not present

## 2017-11-30 MED ORDER — FLUTICASONE PROPIONATE 50 MCG/ACT NA SUSP: 2.0000 | Freq: Every day | NASAL | 6 refills | Status: DC

## 2017-11-30 MED ORDER — MONTELUKAST SODIUM 10 MG PO TABS: 10.0000 mg | ORAL_TABLET | Freq: Every day | ORAL | 3 refills | Status: DC

## 2017-11-30 MED ORDER — AMOXICILLIN-POT CLAVULANATE 875-125 MG PO TABS: 1.0000 | ORAL_TABLET | Freq: Two times a day (BID) | ORAL | 0 refills | Status: DC

## 2017-11-30 NOTE — Patient Instructions (Signed)
Keep taking your claritin.  Continue to take tylenol and/or ibuprofen as needed for pain, headache, fever, aches etc.  Take as directed.  ADD to that singulaire and a steroid nasal spray.  These will all help to decrease fliud and swelling in your nose and sinuses.    Hold the augmentin and start taking if you have sudden worsening of severe facial and/or sinus pain with fever.  Or if your symptoms continue and do not improve over 8-10 days.       Sinusitis, Adult Sinusitis is soreness and inflammation of your sinuses. Sinuses are hollow spaces in the bones around your face. Your sinuses are located:  Around your eyes.  In the middle of your forehead.  Behind your nose.  In your cheekbones.  Your sinuses and nasal passages are lined with a stringy fluid (mucus). Mucus normally drains out of your sinuses. When your nasal tissues become inflamed or swollen, the mucus can become trapped or blocked so air cannot flow through your sinuses. This allows bacteria, viruses, and funguses to grow, which leads to infection. Sinusitis can develop quickly and last for 7?10 days (acute) or for more than 12 weeks (chronic). Sinusitis often develops after a cold. What are the causes? This condition is caused by anything that creates swelling in the sinuses or stops mucus from draining, including:  Allergies.  Asthma.  Bacterial or viral infection.  Abnormally shaped bones between the nasal passages.  Nasal growths that contain mucus (nasal polyps).  Narrow sinus openings.  Pollutants, such as chemicals or irritants in the air.  A foreign object stuck in the nose.  A fungal infection. This is rare.  What increases the risk? The following factors may make you more likely to develop this condition:  Having allergies or asthma.  Having had a recent cold or respiratory tract infection.  Having structural deformities or blockages in your nose or sinuses.  Having a weak immune  system.  Doing a lot of swimming or diving.  Overusing nasal sprays.  Smoking.  What are the signs or symptoms? The main symptoms of this condition are pain and a feeling of pressure around the affected sinuses. Other symptoms include:  Upper toothache.  Earache.  Headache.  Bad breath.  Decreased sense of smell and taste.  A cough that may get worse at night.  Fatigue.  Fever.  Thick drainage from your nose. The drainage is often green and it may contain pus (purulent).  Stuffy nose or congestion.  Postnasal drip. This is when extra mucus collects in the throat or back of the nose.  Swelling and warmth over the affected sinuses.  Sore throat.  Sensitivity to light.  How is this diagnosed? This condition is diagnosed based on symptoms, a medical history, and a physical exam. To find out if your condition is acute or chronic, your health care provider may:  Look in your nose for signs of nasal polyps.  Tap over the affected sinus to check for signs of infection.  View the inside of your sinuses using an imaging device that has a light attached (endoscope).  If your health care provider suspects that you have chronic sinusitis, you may also:  Be tested for allergies.  Have a sample of mucus taken from your nose (nasal culture) and checked for bacteria.  Have a mucus sample examined to see if your sinusitis is related to an allergy.  If your sinusitis does not respond to treatment and it lasts longer than 8  weeks, you may have an MRI or CT scan to check your sinuses. These scans also help to determine how severe your infection is. In rare cases, a bone biopsy may be done to rule out more serious types of fungal sinus disease. How is this treated? Treatment for sinusitis depends on the cause and whether your condition is chronic or acute. If a virus is causing your sinusitis, your symptoms will go away on their own within 10 days. You may be given medicines to  relieve your symptoms, including:  Topical nasal decongestants. They shrink swollen nasal passages and let mucus drain from your sinuses.  Antihistamines. These drugs block inflammation that is triggered by allergies. This can help to ease swelling in your nose and sinuses.  Topical nasal corticosteroids. These are nasal sprays that ease inflammation and swelling in your nose and sinuses.  Nasal saline washes. These rinses can help to get rid of thick mucus in your nose.  If your condition is caused by bacteria, you will be given an antibiotic medicine. If your condition is caused by a fungus, you will be given an antifungal medicine. Surgery may be needed to correct underlying conditions, such as narrow nasal passages. Surgery may also be needed to remove polyps. Follow these instructions at home: Medicines  Take, use, or apply over-the-counter and prescription medicines only as told by your health care provider. These may include nasal sprays.  If you were prescribed an antibiotic medicine, take it as told by your health care provider. Do not stop taking the antibiotic even if you start to feel better. Hydrate and Humidify  Drink enough water to keep your urine clear or pale yellow. Staying hydrated will help to thin your mucus.  Use a cool mist humidifier to keep the humidity level in your home above 50%.  Inhale steam for 10-15 minutes, 3-4 times a day or as told by your health care provider. You can do this in the bathroom while a hot shower is running.  Limit your exposure to cool or dry air. Rest  Rest as much as possible.  Sleep with your head raised (elevated).  Make sure to get enough sleep each night. General instructions  Apply a warm, moist washcloth to your face 3-4 times a day or as told by your health care provider. This will help with discomfort.  Wash your hands often with soap and water to reduce your exposure to viruses and other germs. If soap and water are  not available, use hand sanitizer.  Do not smoke. Avoid being around people who are smoking (secondhand smoke).  Keep all follow-up visits as told by your health care provider. This is important. Contact a health care provider if:  You have a fever.  Your symptoms get worse.  Your symptoms do not improve within 10 days. Get help right away if:  You have a severe headache.  You have persistent vomiting.  You have pain or swelling around your face or eyes.  You have vision problems.  You develop confusion.  Your neck is stiff.  You have trouble breathing. This information is not intended to replace advice given to you by your health care provider. Make sure you discuss any questions you have with your health care provider. Document Released: 08/09/2005 Document Revised: 04/04/2016 Document Reviewed: 06/04/2015 Elsevier Interactive Patient Education  2018 [employer].   Fluticasone nasal spray What is this medicine? FLUTICASONE (floo TIK a sone) is a corticosteroid. This medicine is used to treat  the symptoms of allergies like sneezing, itchy red eyes, and itchy, runny, or stuffy nose. This medicine is also used to treat nasal polyps. This medicine may be used for other purposes; ask your health care provider or pharmacist if you have questions. COMMON BRAND NAME(S): Flonase, Flonase Allergy Relief, Flonase Sensimist, Veramyst, XHANCE What should I tell my health care provider before I take this medicine? They need to know if you have any of these conditions: -cataracts -glaucoma -infection, like tuberculosis, herpes, or fungal infection -recent surgery on nose or sinuses -taking a corticosteroid by mouth -an unusual or allergic reaction to fluticasone, steroids, other medicines, foods, dyes, or preservatives -pregnant or trying to get pregnant -breast-feeding How should I use this medicine? This medicine is for use in the nose. Follow the directions on your product or  prescription label. This medicine works best if used at regular intervals. Do not use more often than directed. Make sure that you are using your nasal spray correctly. After 6 months of daily use for allergies, talk to your doctor or health care professional before using it for a longer time. Ask your doctor or health care professional if you have any questions. Talk to your pediatrician regarding the use of this medicine in children. Special care may be needed. Some products have been used for allergies in children as young as 2 years. After 2 months of daily use without a prescription in a child, talk to your pediatrician before using it for a longer time. Use of this medicine for nasal polyps is not approved in children. Overdosage: If you think you have taken too much of this medicine contact a poison control center or emergency room at once. NOTE: This medicine is only for you. Do not share this medicine with others. What if I miss a dose? If you miss a dose, use it as soon as you remember. If it is almost time for your next dose, use only that dose and continue with your regular schedule. Do not use double or extra doses. What may interact with this medicine? -certain antibiotics like clarithromycin and telithromycin -certain medicines for fungal infections like ketoconazole, itraconazole, and voriconazole -conivaptan -nefazodone -some medicines for HIV -vaccines This list may not describe all possible interactions. Give your health care provider a list of all the medicines, herbs, non-prescription drugs, or dietary supplements you use. Also tell them if you smoke, drink alcohol, or use illegal drugs. Some items may interact with your medicine. What should I watch for while using this medicine? Visit your doctor or health care professional for regular checks on your progress. Some symptoms may improve within 12 hours after starting use. Check with your doctor or health care professional if  there is no improvement in your symptoms after 3 weeks of use. This medicine may increase your risk of getting an infection. Tell your doctor or health care professional if you are around anyone with measles or chickenpox, or if you develop sores or blisters that do not heal properly. What side effects may I notice from receiving this medicine? Side effects that you should report to your doctor or health care professional as soon as possible: -allergic reactions like skin rash, itching or hives, swelling of the face, lips, or tongue -changes in vision -crusting or sores in the nose -nosebleed -signs and symptoms of infection like fever or chills; cough; sore throat -white patches or sores in the mouth or nose Side effects that usually do not require medical attention (report  to your doctor or health care professional if they continue or are bothersome): -burning or irritation inside the nose or throat -cough -headache -unusual taste or smell This list may not describe all possible side effects. Call your doctor for medical advice about side effects. You may report side effects to FDA at 1-800-FDA-1088. Where should I keep my medicine? Keep out of the reach of children. Store at room temperature between 15 and 30 degrees C (59 and 86 degrees F). Avoid exposure to extreme heat, cold, or light. Throw away any unused medicine after the expiration date. NOTE: This sheet is a summary. It may not cover all possible information. If you have questions about this medicine, talk to your doctor, pharmacist, or health care provider.  2018 Elsevier/Gold Standard (2016-05-21 14:23:12)  Nasal Allergies Nasal allergies are a reaction to allergens in the air. Allergens are particles in the air that cause your body to have an allergic reaction. Nasal allergies are not passed from person to person (are not contagious). They cannot be cured, but they can be controlled. What are the causes? Seasonal nasal  allergies (hay fever) are caused by pollen allergens that come from grasses, trees, and weeds. Year-round nasal allergies (perennial allergic rhinitis) are caused by allergens such as house dust mites, pet dander, and mold spores. What increases the risk? The following factors may make you more likely to develop this condition:  Having certain health conditions. These include: ? Other types of allergies, such as food allergies. ? Asthma. ? Eczema.  Having a close relative who has allergies or asthma.  Exposure to house dust, pollen, dander, or other allergens at home or at work.  Exposure to air pollution or secondhand smoke when you were a child.  What are the signs or symptoms? Symptoms of this condition include:  Sneezing.  Runny nose or stuffy nose (congestion).  Watery (tearing) eyes.  Itchy eyes, nose, mouth, throat, skin, or other area.  Sore throat.  Headache.  Decreased sense of smell or taste.  Fatigue. This may occur if you have trouble sleeping due to allergies.  Swollen eyelids.  How is this diagnosed? This condition is diagnosed with a medical history and physical exam. Allergy testing may be done to determine exactly what triggers your nasal allergies. How is this treated? There is no cure for nasal allergies. Treatment focuses on controlling your symptoms, and it may include:  Medicines that block allergy symptoms. These may include allergy shots, nasal sprays, and oral antihistamines.  Avoiding the allergen.  Follow these instructions at home:  Avoid the allergen that is causing your symptoms, if possible.  Keep windows closed. If possible, use air conditioning when pollen counts are high.  Do not use fans in your home.  Do not hang clothes outside to dry.  Wear sunglasses to keep pollen out of your eyes.  Wash your hands right away after you touch household pets.  Take over-the-counter and prescription medicines only as told by your health  care provider.  Keep all follow-up visits as told by your health care provider. This is important. Contact a health care provider if:  You have a fever.  You develop a cough that does not go away (is persistent).  You start to wheeze.  Your symptoms do not improve with treatment.  You have thick nasal discharge.  You start to have nosebleeds. Get help right away if:  Your tongue or your lips are swollen.  You have trouble breathing.  You feel light-headed  or you feel like you are going to faint.  You have cold sweats. This information is not intended to replace advice given to you by your health care provider. Make sure you discuss any questions you have with your health care provider. Document Released: 08/09/2005 Document Revised: 01/12/2016 Document Reviewed: 02/19/2015 Elsevier Interactive Patient Education  [employer].

## 2017-11-30 NOTE — Progress Notes (Signed)
Patient ID: Cheyenne Hanson, female    DOB: 08/29/1955, 62 y.o.   MRN: [id number]  PCP: Susy Frizzle, MD  Chief Complaint  Patient presents with  . Sinus Problem    Subjective:   Cheyenne Hanson is a 62 y.o. female, with PMH of HLD and recent lobar PNA (admitted 07/20/17-07/22/17) presents with sinus congestion with pain and pressure.  Symptoms began roughly 5 or 6 days ago with nasal congestion, nasal drainage, sinus pressure, headaches, low-grade fever, decreased energy, sweats.  T-max was 99.9.  No improvement with her daily Claritin and with over-the-counter analgesics.  She does have seasonal allergies but she believes that she may have caught a cold from her husband who was sick with same symptoms.  She currently has clear nasal drainage but feels postnasal drip and productive cough with yellow sputum.  She has popping and clicking in her ears which feel clogged, she continues to have bad headache, pain in her teeth, and left maxillary sinus pressure and pain which is moderate, constant and has been worsening for 6 days.  Her cough is described as a "tickle" irritating and occasionally productive, usually in the mornings, she denies any wheeze, tightness, chest pain, shortness of breath.  She does continue to have intermittent sweats and does feel overall fatigued.   She recently had a repeated CT scan her chest which showed cleared pneumonia and residual  lymph nodes.  Her last antibiotic was Levaquin more than 4 months ago.   No other history of recurrent infections. No other alleviating or aggravating factors.   Patient Active Problem List   Diagnosis Date Noted  . Lobar pneumonia ([place])   . CAP (community acquired pneumonia) 07/21/2017  . HLD (hyperlipidemia) 07/21/2017     Prior to Admission medications   Medication Sig Start Date End Date Taking? Authorizing Provider  azelastine (ASTELIN) 0.1 % nasal spray PLACE 2 SPRAYS INTO BOTH NOSTRILS 2 (TWO) TIMES DAILY. 11/28/17  02/26/18 Yes PickardCammie Mcgee, MD  clonazePAM (KLONOPIN) 0.5 MG tablet Take 0.5 mg by mouth at bedtime as needed for anxiety.  11/25/16  Yes [provider]  ezetimibe (ZETIA) 10 MG tablet Take 10 mg by mouth every evening.    Yes [provider]  guaiFENesin (MUCINEX) 600 MG 12 hr tablet Take 600 mg by mouth 4 (four) times daily.    Yes [provider]  ibuprofen (ADVIL,MOTRIN) 200 MG tablet Take 600 mg by mouth every 6 (six) hours as needed for headache or moderate pain.   Yes Provider, without present on her face moderate Mucinex getting up there is a 112-hour formula was last longer systems is sometimes not be given notice that it is in her system for to work against you might be where you are out and up in particular bed you may be discussed with the pharmacist you remember he took last time blood products continue Reglan he will just RV pressure trial with primary redirecting see how it goes Claritin or just try more he is some sleep some slight kids it really dries a month pretty good sometimes it will drive the ear so that the years adult is just you do not want to double up your antihistamine to try Claritin Zyrtec or Claritin talking fast and hard to understand you have any questions about appropriate that medicine now and on the right back and then we can get out of here historical, MD  loratadine (CLARITIN) 10 MG tablet Take 10 mg  by mouth daily.   Yes [provider]  PAZEO 0.7 % SOLN INSTILL ONE DROP IN [facility] EYE ONCE DAILY IN THE EVENING 08/12/17  Yes [provider]     Allergies  Allergen Reactions  . Latex Other (See Comments)    Blisters if she wears the gloves  . Statins     Muscle aches     Family History  Problem Relation Age of Onset  . Diabetes Mother   . Cancer Father        lung  . Stroke Sister   . Hyperlipidemia Sister   . Diabetes Sister   . Heart disease Brother   . Hyperlipidemia Brother   . Breast cancer Cousin   .  Diabetes Cousin   . Diabetes Maternal Grandmother   . Amblyopia Neg Hx   . Blindness Neg Hx   . Cataracts Neg Hx   . Glaucoma Neg Hx   . Macular degeneration Neg Hx   . Retinal detachment Neg Hx   . Strabismus Neg Hx   . Retinitis pigmentosa Neg Hx      Social History   Socioeconomic History  . Marital status: Married    Spouse name: Not on file  . Number of children: Not on file  . Years of education: Not on file  . Highest education level: Not on file  Occupational History  . Not on file  Social Needs  . Financial resource strain: Not on file  . Food insecurity:    Worry: Not on file    Inability: Not on file  . Transportation needs:    Medical: Not on file    Non-medical: Not on file  Tobacco Use  . Smoking status: Never Smoker  . Smokeless tobacco: Never Used  Substance and Sexual Activity  . Alcohol use: Yes    Alcohol/week: 1.2 oz    Types: 2 Glasses of wine per week    Frequency: Never  . Drug use: No  . Sexual activity: Not Currently  Lifestyle  . Physical activity:    Days per week: Not on file    Minutes per session: Not on file  . Stress: Not on file  Relationships  . Social connections:    Talks on phone: Not on file    Gets together: Not on file    Attends religious service: Not on file    Active member of club or organization: Not on file    Attends meetings of clubs or organizations: Not on file    Relationship status: Not on file  . Intimate partner violence:    Fear of current or ex partner: Not on file    Emotionally abused: Not on file    Physically abused: Not on file    Forced sexual activity: Not on file  Other Topics Concern  . Not on file  Social History Narrative  . Not on file     Review of Systems  Constitutional: Positive for diaphoresis, fatigue and fever. Negative for activity change, appetite change, chills and unexpected weight change.  HENT: Positive for congestion, postnasal drip, rhinorrhea, sinus pressure, sinus  pain, sneezing and voice change. Negative for ear discharge, ear pain, facial swelling and trouble swallowing.   Eyes: Negative.   Respiratory: Negative for apnea, choking, chest tightness, shortness of breath and wheezing.   Cardiovascular: Negative.  Negative for chest pain and palpitations.  Endocrine: Negative.   Genitourinary: Negative.   Skin: Negative.  Negative for color change, pallor  and rash.  Allergic/Immunologic: Negative.   Neurological: Negative.   Hematological: Negative.   Psychiatric/Behavioral: Negative.   All other systems reviewed and are negative.      Objective:    Vitals:   11/30/17 1126  BP: 118/70  Pulse: 88  Resp: 16  Temp: 99.2 F (37.3 C)  TempSrc: Oral  SpO2: 96%  Weight: 177 lb (80.3 kg)  Height: 5\' 9"  (1.753 m)     Physical Exam  Constitutional: She is oriented to person, place, and time. She appears well-nourished. No distress.  HENT:  Head: Normocephalic and atraumatic.  Right Ear: External ear and ear canal normal. Tympanic membrane is injected. A middle ear effusion is present.  Left Ear: External ear and ear canal normal. Tympanic membrane is injected. A middle ear effusion is present.  Mouth/Throat: No oropharyngeal exudate.  Left nare with edematous, boggy and pale nasal turbinates with clear discharge, right nares with edematous and erythematous mucosa with clear discharge Left maxillary sinus tenderness to palpation, nontender right maxillary sinus and frontal sinus   Eyes: Pupils are equal, round, and reactive to light. Conjunctivae and EOM are normal. No scleral icterus.  Neck: Trachea normal, normal range of motion and phonation normal. Neck supple. No tracheal deviation present.  Cardiovascular: Normal rate, regular rhythm and intact distal pulses. Exam reveals no gallop and no friction rub.  No murmur heard. Pulses:      Radial pulses are 2+ on the right side, and 2+ on the left side.       Posterior tibial pulses are 2+ on  the right side, and 2+ on the left side.  Pulmonary/Chest: Effort normal and breath sounds normal. No stridor. No tachypnea. No respiratory distress. She has no decreased breath sounds. She has no wheezes. She has no rhonchi. She has no rales. She exhibits no tenderness.  Abdominal: Soft. Normal appearance and bowel sounds are normal. She exhibits no distension. There is no tenderness.  Musculoskeletal: Normal range of motion.  Lymphadenopathy:    She has no cervical adenopathy.  Neurological: She is alert and oriented to person, place, and time. She exhibits normal muscle tone.  Skin: Skin is warm and dry. No rash noted. She is not diaphoretic. No pallor.  Psychiatric: Judgment normal.  Nursing note and vitals reviewed.           Assessment & Plan:   1. Rhinosinusitis Likely secondary to both baseline nasal allergies and URI She has mild tenderness to left maxillary sinus on exam, no wincing or guarding, patient afebrile today.  Physical antibiotics are not indicated right now as I do not currently suspect acute bacterial sinusitis.  However given her recent community-acquired pneumonia and complications with hospitalization prolonged antibiotic use, and with symptoms persisting for 5-6 days with intermittent fever, I have printed Augmentin prescription for her to hold, and to fill and start taking only if she has acute worsening of sinus pain with fever or symptoms prolonged beyond 8-10 days.  2. Allergic rhinitis, unspecified seasonality, unspecified trigger We will attempt to more aggressively treat patient's baseline allergic rhinitis, she will continue Claritin and will add Singulair and Flonase. - montelukast (SINGULAIR) 10 MG tablet; Take 1 tablet (10 mg total) by mouth at bedtime.  Dispense: 30 tablet; Refill: 3 - fluticasone (FLONASE) 50 MCG/ACT nasal spray; Place 2 sprays into both nostrils daily.  Dispense: 16 g; Refill: 6  Patient was encouraged to follow-up for recheck if  not feeling better in roughly 5 days.  Kristeen Miss  Arther Dames 11/30/17 11:37 AM

## 2018-03-08 NOTE — Progress Notes (Signed)
[facility] Note  03/10/2018     CHIEF COMPLAINT Patient presents for Retina Follow Up   HISTORY OF PRESENT ILLNESS: Cheyenne Hanson is a 62 y.o. female who presents to the clinic today for:   HPI    Retina Follow Up    Patient presents with  Other.  In left eye.  Severity is moderate.  Duration of 6 months.  Since onset it is stable.  I, the attending physician,  performed the HPI with the patient and updated documentation appropriately.          Comments    Pt presents for 6 months f/u for chorioretinal atrophy OS, pt states VA has been fine, pt denies flashes, floaters, pain or wavy vision, pt is using Pazeo gtts PRN,        Last edited by Bernarda Caffey, MD on 03/10/2018  8:10 AM. (History)    Pt states she saw Dr. 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 Renaldo Harrison for concern of retinal hole OS  LEE- 12.21.18 (S. Bernstorf) [[facility] OD: 20/20 OS: 20/50] Ocular Hx- ocular migraine  PMH- elevated cholesterol, migraines     CURRENT MEDICATIONS: Current Outpatient Medications (Ophthalmic Drugs)  Medication Sig  . PAZEO 0.7 % SOLN INSTILL ONE DROP IN [facility] EYE ONCE DAILY IN THE EVENING   No current facility-administered medications for this visit.  (Ophthalmic Drugs)   Current Outpatient Medications (Other)  Medication Sig  . amoxicillin-clavulanate (AUGMENTIN) 875-125 MG tablet Take 1 tablet by mouth 2 (two) times daily. (Patient not taking: Reported on 03/10/2018)  . azelastine (ASTELIN) 0.1 % nasal spray PLACE 2 SPRAYS INTO BOTH NOSTRILS 2 (TWO) TIMES DAILY.  Marland Kitchen azelastine (ASTELIN) 0.1 % nasal spray PLACE 2  SPRAYS INTO BOTH NOSTRILS 2 (TWO) TIMES DAILY.  . clonazePAM (KLONOPIN) 0.5 MG tablet Take 0.5 mg by mouth at bedtime as needed for anxiety.   Marland Kitchen ezetimibe (ZETIA) 10 MG tablet Take 10 mg by mouth every evening.   . fluticasone (FLONASE) 50 MCG/ACT nasal spray Place 2 sprays into both nostrils daily.  Marland Kitchen guaiFENesin (MUCINEX) 600 MG 12 hr tablet Take 600 mg by mouth 4 (four) times daily.   Marland Kitchen ibuprofen (ADVIL,MOTRIN) 200 MG tablet Take 600 mg by mouth every 6 (six) hours as needed for headache or moderate pain.  Marland Kitchen loratadine (CLARITIN) 10 MG tablet Take 10 mg by mouth daily.  . montelukast (SINGULAIR) 10 MG tablet Take 1 tablet (10 mg total) by mouth at bedtime.   No current facility-administered medications for this visit.  (Other)      REVIEW OF SYSTEMS: ROS    Positive for: Eyes, Psychiatric   Negative for: Constitutional, Gastrointestinal, Neurological, Skin, Genitourinary, Musculoskeletal, HENT, Endocrine, Cardiovascular, Respiratory, Allergic/Imm, Heme/Lymph   Last edited by Debbrah Alar, COT on 03/10/2018  8:09 AM. (History)       ALLERGIES Allergies  Allergen Reactions  . Latex Other (See Comments)    Blisters if she wears the gloves  . Statins     Muscle aches    PAST MEDICAL HISTORY Past Medical History:  Diagnosis Date  . Anemia   .  Anxiety    uses klonopin sparingly (30 lasts a year)  . Basal cell carcinoma    "face, back, chest" (07/21/2017)  . CAP (community acquired pneumonia) 07/20/2017  . GERD (gastroesophageal reflux disease)   . Headache    "maybe weekly; more w/seasonal allergies" (07/21/2017)  . Hyperlipidemia   . Mitral valve prolapse   . OSA on CPAP    cpap since 2008  . Pneumonia 1990s X 1  . Seasonal allergies    Past Surgical History:  Procedure Laterality Date  . ANTERIOR CERVICAL DECOMP/DISCECTOMY FUSION  ?2010    Dr. Ronnald Ramp  . ARTHROSCOPIC REPAIR ACL Left 1997  . BACK SURGERY    . BASAL CELL CARCINOMA EXCISION     "face, back,  chest" (07/21/2017)    FAMILY HISTORY Family History  Problem Relation Age of Onset  . Diabetes Mother   . Cancer Father        lung  . Stroke Sister   . Hyperlipidemia Sister   . Diabetes Sister   . Heart disease Brother   . Hyperlipidemia Brother   . Breast cancer Cousin   . Diabetes Cousin   . Diabetes Maternal Grandmother   . Amblyopia Neg Hx   . Blindness Neg Hx   . Cataracts Neg Hx   . Glaucoma Neg Hx   . Macular degeneration Neg Hx   . Retinal detachment Neg Hx   . Strabismus Neg Hx   . Retinitis pigmentosa Neg Hx     SOCIAL HISTORY Social History   Tobacco Use  . Smoking status: Never Smoker  . Smokeless tobacco: Never Used  Substance Use Topics  . Alcohol use: Yes    Alcohol/week: 1.2 oz    Types: 2 Glasses of wine per week    Frequency: Never  . Drug use: No         OPHTHALMIC EXAM:  Base Eye Exam    Visual Acuity (Snellen - Linear)      Right Left   Dist cc 20/20 -1 20/20   Dist ph cc NI    Correction:  Glasses       Tonometry (Tonopen, 8:15 AM)      Right Left   Pressure 13 19       Pupils      Dark Light Shape React APD   Right 4 2 Round Brisk None   Left 4 2 Round Brisk None       Visual Fields (Counting fingers)      Left Right    Full Full       Extraocular Movement      Right Left    Full, Ortho Full, Ortho       Neuro/Psych    Oriented x3:  Yes   Mood/Affect:  Normal       Dilation    Both eyes:  1.0% Mydriacyl, 2.5% Phenylephrine @ 8:14 AM        Slit Lamp and Fundus Exam    Slit Lamp Exam      Right Left   Lids/Lashes Dermatochalasis - upper lid Dermatochalasis - upper lid   Conjunctiva/Sclera White and quiet White and quiet   Cornea 2-3+ Punctate epithelial erosions, dry tear film, irregular epi inferiorly Inferior 1+ Punctate epithelial erosions, dry tear film   Anterior Chamber Deep and quiet Deep and quiet   Iris Round and dilated Round and dilated   Lens 2+ Nuclear sclerosis, 2+ Cortical cataract 2+  Nuclear sclerosis, 2+ Cortical cataract  Vitreous Vitreous syneresis, Posterior vitreous detachment Vitreous syneresis, Posterior vitreous detachment       Fundus Exam      Right Left   Disc Normal Normal   C/D Ratio 0.3 0.45   Macula Flat, Blunted foveal reflex, mild Retinal pigment epithelial mottling, No heme or edema Flat, mild Retinal pigment epithelial mottling, No heme or edema   Vessels Normal Normal   Periphery Attached, very mild scattered reticular changes Attached, small round area of chorioretinal atrophy at 0500 mid-zone, mild scattered reticular changes          IMAGING AND PROCEDURES  Imaging and Procedures for 09/07/17  OCT, Retina - OU - Both Eyes       Right Eye Quality was good. Central Foveal Thickness: 305. Progression has been stable. Findings include normal foveal contour, no IRF, no SRF.   Left Eye Quality was good. Central Foveal Thickness: 300. Progression has been stable. Findings include normal foveal contour, no IRF, no SRF.   Notes *Images captured and stored on drive  Diagnosis / Impression:  NFP; no IRF/SRF OU  Clinical management:  See below  Abbreviations: NFP - Normal foveal profile. CME - cystoid macular edema. PED - pigment epithelial detachment. IRF - intraretinal fluid. SRF - subretinal fluid. EZ - ellipsoid zone. ERM - epiretinal membrane. ORA - outer retinal atrophy. ORT - outer retinal tubulation. SRHM - subretinal hyper-reflective material                 ASSESSMENT/PLAN:    ICD-10-CM   1. 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 Retinal edema H35.81 OCT, Retina - OU - Both Eyes  3. Posterior vitreous detachment of both eyes H43.813   4. 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 Chorioretinal atrophy OS  Round focal area of chorioretinal atrophy, 5 oclock midzone -- stable / no change  No retinal tear, subretinal fluid or RD  Monitor  F/u 1-2 years, sooner  prn  2. No retinal edema on exam or OCT  3. PVD / vitreous syneresis OU-   Discussed findings and prognosis  No RT or RD on 360 peripheral exam  Reviewed s/s of RT/RD  Strict return precautions for any such RT/RD signs/symptoms  4. Combined form of age-related cataract OU-  - The symptoms of cataract, surgical options, and treatments and risks were discussed with patient. - discussed diagnosis and progression - not yet visually significant - monitor for now  5. DES OU-  - very dry OU - recommend artificial tears   Ophthalmic Meds Ordered this visit:  No orders of the defined types were placed in this encounter.     Return in about 2 years (around 03/10/2020) for F/U Chorioretinal atrophy OS, DFE, OCT.  There are no Patient Instructions on file for this visit.   Explained the diagnoses, plan, and follow up with the patient and they expressed understanding.  Patient expressed understanding of the importance of proper follow up care.   This document serves as a record of services personally performed by Gardiner Sleeper, MD, PhD. It was created on their behalf by Catha Brow, [place], a certified ophthalmic assistant. The creation of this record is the provider's dictation and/or activities during the visit.  Electronically signed by: Catha Brow, [place]  07.17.19 8:49 AM   Gardiner Sleeper, M.D., Ph.D. Diseases & Surgery of the Retina and [place]  I have reviewed the above documentation for accuracy and completeness, and I agree with the above. Gardiner Sleeper, M.D., Ph.D. 03/10/18 8:49 AM     Abbreviations: M myopia (nearsighted); A astigmatism; H hyperopia (farsighted); P presbyopia; Mrx spectacle prescription;  CTL contact lenses; OD right eye; OS left eye; OU both eyes  XT exotropia; ET esotropia; PEK punctate epithelial keratitis; PEE punctate epithelial erosions; DES dry eye syndrome; MGD meibomian gland dysfunction; ATs artificial  tears; PFAT's preservative free artificial tears; [place] nuclear sclerotic cataract; PSC posterior subcapsular cataract; ERM epi-retinal membrane; PVD posterior vitreous detachment; RD retinal detachment; DM diabetes mellitus; DR diabetic retinopathy; NPDR non-proliferative diabetic retinopathy; PDR proliferative diabetic retinopathy; CSME clinically significant macular edema; DME diabetic macular edema; dbh dot blot hemorrhages; CWS cotton wool spot; POAG primary open angle glaucoma; C/D cup-to-disc ratio; HVF humphrey visual field; GVF goldmann visual field; OCT optical coherence tomography; IOP intraocular pressure; BRVO Branch retinal vein occlusion; CRVO central retinal vein occlusion; CRAO central retinal artery occlusion; BRAO branch retinal artery occlusion; RT retinal tear; SB scleral buckle; PPV pars plana vitrectomy; VH Vitreous hemorrhage; PRP panretinal laser photocoagulation; IVK intravitreal kenalog; VMT vitreomacular traction; MH Macular hole;  NVD neovascularization of the disc; NVE neovascularization elsewhere; AREDS age related eye disease study; ARMD age related macular degeneration; POAG primary open angle glaucoma; EBMD epithelial/anterior basement membrane dystrophy; ACIOL anterior chamber intraocular lens; IOL intraocular lens; PCIOL posterior chamber intraocular lens; Phaco/IOL phacoemulsification with intraocular lens placement; [place] photorefractive keratectomy; LASIK laser assisted in situ keratomileusis; HTN hypertension; DM diabetes mellitus; COPD chronic obstructive pulmonary disease

## 2018-03-10 ENCOUNTER — Encounter (INDEPENDENT_AMBULATORY_CARE_PROVIDER_SITE_OTHER): Payer: Self-pay | Admitting: Ophthalmology

## 2018-03-10 ENCOUNTER — Ambulatory Visit (INDEPENDENT_AMBULATORY_CARE_PROVIDER_SITE_OTHER): Payer: BLUE CROSS/BLUE SHIELD | Admitting: Ophthalmology

## 2018-03-10 DIAGNOSIS — H25813 Combined forms of age-related cataract, bilateral: Secondary | ICD-10-CM | POA: Diagnosis not present

## 2018-03-10 DIAGNOSIS — H3581 Retinal edema: Secondary | ICD-10-CM

## 2018-03-10 DIAGNOSIS — H31102 Choroidal degeneration, unspecified, left eye: Secondary | ICD-10-CM | POA: Diagnosis not present

## 2018-03-10 DIAGNOSIS — H43813 Vitreous degeneration, bilateral: Secondary | ICD-10-CM

## 2018-08-25 ENCOUNTER — Other Ambulatory Visit: Payer: Self-pay

## 2018-08-25 ENCOUNTER — Ambulatory Visit: Payer: BLUE CROSS/BLUE SHIELD | Admitting: Family Medicine

## 2018-08-25 ENCOUNTER — Encounter: Payer: Self-pay | Admitting: Family Medicine

## 2018-08-25 VITALS — BP 140/82 | HR 82 | Temp 98.7°F | Resp 14 | Ht 69.0 in | Wt 174.0 lb

## 2018-08-25 DIAGNOSIS — E785 Hyperlipidemia, unspecified: Secondary | ICD-10-CM

## 2018-08-25 DIAGNOSIS — J019 Acute sinusitis, unspecified: Secondary | ICD-10-CM

## 2018-08-25 DIAGNOSIS — J309 Allergic rhinitis, unspecified: Secondary | ICD-10-CM | POA: Diagnosis not present

## 2018-08-25 DIAGNOSIS — F411 Generalized anxiety disorder: Secondary | ICD-10-CM

## 2018-08-25 LAB — COMPREHENSIVE METABOLIC PANEL
AG RATIO: 1.9 (calc) (ref 1.0–2.5)
ALBUMIN MSPROF: 4.6 g/dL (ref 3.6–5.1)
ALT: 23 U/L (ref 6–29)
AST: 21 U/L (ref 10–35)
Alkaline phosphatase (APISO): 47 U/L (ref 33–130)
BILIRUBIN TOTAL: 0.6 mg/dL (ref 0.2–1.2)
BUN: 13 mg/dL (ref 7–25)
CALCIUM: 9.6 mg/dL (ref 8.6–10.4)
CO2: 27 mmol/L (ref 20–32)
Chloride: 103 mmol/L (ref 98–110)
Creat: 0.81 mg/dL (ref 0.50–0.99)
GLUCOSE: 104 mg/dL — AB (ref 65–99)
Globulin: 2.4 g/dL (calc) (ref 1.9–3.7)
POTASSIUM: 4.6 mmol/L (ref 3.5–5.3)
SODIUM: 138 mmol/L (ref 135–146)
TOTAL PROTEIN: 7 g/dL (ref 6.1–8.1)

## 2018-08-25 LAB — CBC WITH DIFFERENTIAL/PLATELET
Absolute Monocytes: 641 cells/uL (ref 200–950)
BASOS ABS: 53 {cells}/uL (ref 0–200)
Basophils Relative: 0.6 %
EOS ABS: 240 {cells}/uL (ref 15–500)
EOS PCT: 2.7 %
HEMATOCRIT: 41.4 % (ref 35.0–45.0)
HEMOGLOBIN: 13.8 g/dL (ref 11.7–15.5)
LYMPHS ABS: 1958 {cells}/uL (ref 850–3900)
MCH: 30.1 pg (ref 27.0–33.0)
MCHC: 33.3 g/dL (ref 32.0–36.0)
MCV: 90.4 fL (ref 80.0–100.0)
MONOS PCT: 7.2 %
MPV: 9.7 fL (ref 7.5–12.5)
NEUTROS ABS: 6008 {cells}/uL (ref 1500–7800)
NEUTROS PCT: 67.5 %
Platelets: 280 10*3/uL (ref 140–400)
RBC: 4.58 10*6/uL (ref 3.80–5.10)
RDW: 12.5 % (ref 11.0–15.0)
Total Lymphocyte: 22 %
WBC: 8.9 10*3/uL (ref 3.8–10.8)

## 2018-08-25 LAB — LIPID PANEL
CHOLESTEROL: 245 mg/dL — AB (ref <200)
HDL: 56 mg/dL (ref >50)
LDL Cholesterol (Calc): 165 mg/dL (calc) — ABNORMAL HIGH
Non-HDL Cholesterol (Calc): 189 mg/dL (calc) — ABNORMAL HIGH (ref <130)
Total CHOL/HDL Ratio: 4.4 (calc) (ref <5.0)
Triglycerides: 119 mg/dL (ref <150)

## 2018-08-25 MED ORDER — AMOXICILLIN-POT CLAVULANATE 875-125 MG PO TABS: 1.0000 | ORAL_TABLET | Freq: Two times a day (BID) | ORAL | 0 refills | Status: DC

## 2018-08-25 MED ORDER — EZETIMIBE 10 MG PO TABS: 10.0000 mg | ORAL_TABLET | Freq: Every evening | ORAL | 2 refills | Status: DC

## 2018-08-25 MED ORDER — AZELASTINE HCL 0.1 % NA SOLN: NASAL | 3 refills | Status: DC

## 2018-08-25 MED ORDER — FLUCONAZOLE 150 MG PO TABS: 150.0000 mg | ORAL_TABLET | Freq: Once | ORAL | 1 refills | Status: AC

## 2018-08-25 MED ORDER — MONTELUKAST SODIUM 10 MG PO TABS: 10.0000 mg | ORAL_TABLET | Freq: Every day | ORAL | 2 refills | Status: DC

## 2018-08-25 MED ORDER — CLONAZEPAM 0.5 MG PO TABS: 0.5000 mg | ORAL_TABLET | Freq: Every evening | ORAL | 0 refills | Status: DC | PRN

## 2018-08-25 MED ORDER — GUAIFENESIN-CODEINE 100-10 MG/5ML PO SOLN: 5.0000 mL | Freq: Four times a day (QID) | ORAL | 0 refills | Status: DC | PRN

## 2018-08-25 NOTE — Patient Instructions (Addendum)
Schedule a physical in the Spring

## 2018-08-25 NOTE — Progress Notes (Signed)
   Subjective:    Patient ID: Cheyenne Hanson First, female    DOB: [birth year], 63 y.o.   MRN: [id number]  Patient presents for Illness (x1 week- semi-productive cough with yellow sputum, sinus pressure)  Sinus pressure and drainage headachefor past 2 weeks, no flu contacts   Works as Chiropractor  Has taken [employer] D, took singulair last night clairtin daily /astelin  No fever, occ chills   No vomiting or diarrhea   Has more post nasal drip     Has not had labs since 2018 due for lipid panel, needs meds refilled   History of GAD, uses klonopin very sparingly, ones she has have expired, only takes when she gets signifincantly overwhelmed, does not feel depressed. No daily symptoms   Review Of Systems:  GEN- denies fatigue, fever, weight loss,weakness, recent illness HEENT- denies eye drainage, change in vision, +nasal discharge, CVS- denies chest pain, palpitations RESP- denies SOB,+ cough, wheeze ABD- denies N/V, change in stools, abd pain GU- denies dysuria, hematuria, dribbling, incontinence MSK- denies joint pain, muscle aches, injury Neuro- denies headache, dizziness, syncope, seizure activity       Objective:    BP 140/82   Pulse 82   Temp 98.7 F (37.1 C) (Oral)   Resp 14   Ht 5\' 9"  (1.753 m)   Wt 174 lb (78.9 kg)   SpO2 97%   BMI 25.70 kg/m  GEN- NAD, alert and oriented x3 HEENT- PERRL, EOMI, non injected sclera, pink conjunctiva, MMM, oropharynx mild injection, TM clear bilat no effusion,  + maxillary sinus tenderness, inflammed turbinates,  Nasal drainage  Neck- Supple, shotty ant LAD CVS- RRR, no murmur RESP-CTAB Psych- normal affect and mood  EXT- No edema Pulses- Radial 2+          Assessment & Plan:      Problem List Items Addressed This Visit      Unprioritized   GAD (generalized anxiety disorder)    Mild symptoms, rare use of klonopin refilled      HLD (hyperlipidemia) - Primary    Check lipids, CMET Refilled zetia      Relevant Medications   ezetimibe (ZETIA) 10 MG tablet   Other Relevant Orders   Lipid panel (Completed)   Comprehensive metabolic panel (Completed)   CBC with Differential/Platelet (Completed)    Other Visit Diagnoses    Allergic rhinitis, unspecified seasonality, unspecified trigger       Relevant Medications   montelukast (SINGULAIR) 10 MG tablet   Acute rhinosinusitis       Augmentin, nasal rinses, astelin, singulair, robitussin codiuene for cough, diflucan due to Yeast infections   Relevant Medications   amoxicillin-clavulanate (AUGMENTIN) 875-125 MG tablet   guaiFENesin-codeine 100-10 MG/5ML syrup   azelastine (ASTELIN) 0.1 % nasal spray      Note: This dictation was prepared with Dragon dictation along with smaller phrase technology. Any transcriptional errors that result from this process are unintentional.

## 2018-08-26 ENCOUNTER — Encounter: Payer: Self-pay | Admitting: Family Medicine

## 2018-08-26 DIAGNOSIS — F411 Generalized anxiety disorder: Secondary | ICD-10-CM | POA: Insufficient documentation

## 2018-08-26 NOTE — Assessment & Plan Note (Signed)
Check lipids, CMET Refilled zetia

## 2018-08-26 NOTE — Assessment & Plan Note (Signed)
Mild symptoms, rare use of klonopin refilled

## 2018-09-07 ENCOUNTER — Encounter: Payer: Self-pay | Admitting: *Deleted

## 2019-06-29 ENCOUNTER — Other Ambulatory Visit: Payer: Self-pay

## 2019-06-29 DIAGNOSIS — Z20822 Contact with and (suspected) exposure to covid-19: Secondary | ICD-10-CM

## 2019-07-01 LAB — NOVEL CORONAVIRUS, NAA: SARS-CoV-2, NAA: NOT DETECTED

## 2019-07-02 ENCOUNTER — Telehealth: Payer: Self-pay | Admitting: Family Medicine

## 2019-07-02 NOTE — Telephone Encounter (Signed)
Patient calling to say that her husband tested positive for covid , she was tested and was negative, however is feeling really bad would like a call back as to what she should do  [phone]

## 2019-07-03 NOTE — Telephone Encounter (Signed)
Pt called and states that she tested negative for COVID on 06/29/2019 but has developed symptoms. She developed them on Sunday. Nasal congestion, runny nose small cough and she just does not feel good. Her husband is positive and has been sent to [facility] with pneumonia. She states that the paperwork she was given says they do not recommend retesting for 12 days after the 1st test. I informed her that she could go be retested but it would not change the treatment as she probably does have COVID since her husband was positive. Informed pt to teat symptoms with otc medication and if she gets worse to please let us know or go to the ER. Pt verbalized understanding.

## 2019-07-03 NOTE — Telephone Encounter (Signed)
[place] on 07/02/19

## 2019-07-04 ENCOUNTER — Encounter: Payer: Self-pay | Admitting: Family Medicine

## 2019-07-04 NOTE — Telephone Encounter (Signed)
I agree with your assessment.  Most likely she does have Covid given her husband diagnosis and her symptoms.  We could certainly recheck her for Covid.  If she is tested early, her test may return negative initially but then turn positive.  I would operate under the suspicion that she has COVID-19.  First I am very sorry to hear about her husband.  He is certainly in a very good institution and I suspect that he will do fine with the new therapeutics that are available.  Second if she develops any shortness of breath or uncontrollable fever she needs to be retested and to seek medical attention immediately if she develops shortness of breath.

## 2019-07-04 NOTE — Telephone Encounter (Signed)
Pt aware.

## 2019-07-10 ENCOUNTER — Other Ambulatory Visit: Payer: Self-pay | Admitting: Family Medicine

## 2019-07-23 ENCOUNTER — Other Ambulatory Visit: Payer: Self-pay

## 2019-07-23 DIAGNOSIS — Z20822 Contact with and (suspected) exposure to covid-19: Secondary | ICD-10-CM

## 2019-07-24 ENCOUNTER — Ambulatory Visit (INDEPENDENT_AMBULATORY_CARE_PROVIDER_SITE_OTHER): Payer: BC Managed Care – PPO | Admitting: Family Medicine

## 2019-07-24 ENCOUNTER — Other Ambulatory Visit: Payer: Self-pay

## 2019-07-24 ENCOUNTER — Encounter: Payer: Self-pay | Admitting: Family Medicine

## 2019-07-24 DIAGNOSIS — R519 Headache, unspecified: Secondary | ICD-10-CM

## 2019-07-24 DIAGNOSIS — Z20822 Contact with and (suspected) exposure to covid-19: Secondary | ICD-10-CM

## 2019-07-24 DIAGNOSIS — Z20828 Contact with and (suspected) exposure to other viral communicable diseases: Secondary | ICD-10-CM

## 2019-07-24 MED ORDER — PREDNISONE 10 MG PO TABS: ORAL_TABLET | ORAL | 0 refills | Status: DC

## 2019-07-24 NOTE — Progress Notes (Signed)
Virtual Visit via Telephone Note  I connected with Cheyenne Hanson on 07/24/19 at 8:06am by telephone and verified that I am speaking with the correct person using two identifiers.      Pt location: at home   Physician location:  In office, [facility], Vic Blackbird MD     On call: patient and physician   I discussed the limitations, risks, security and privacy concerns of performing an evaluation and management service by telephone and the availability of in person appointments. I also discussed with the patient that there may be a patient responsible charge related to this service. The patient expressed understanding and agreed to proceed.   History of Present Illness:  Husband positive for COVID 19 on 11/6, she started displaying symptoms 3 days later.  She was tested however came back negative but because she had the same symptoms cough congestion body aches we diagnosed her with COVID-19 infection as well.  Since then most of her symptoms have resolved with the exception of headaches.  She is getting pain on the top of her head she states.  Denies any facial pain no temporal region pain no sinus drainage or fullness.  Sometimes feels like her head is in a vice.  She does not have any loss of taste or smell no GI symptoms.  She has occasional cough but nothing like previous she may take Tylenol or ibuprofen on occasion.  She is also had random low-grade fevers 99.9 F this is even after ibuprofen she states it is not every day.  She also continues to have fatigue and has joint pain.  She has been taking her regular allergy medicines Mucinex Zyrtec and Singulair.  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  She went ahead and got tested again yesterday because of her persistent symptoms.          Observations/Objective: No acute distress noted over the phone  Assessment and Plan: Headaches with random low-grade fevers approximately 3 to 4  weeks since her initial infection.  Difficult to tell if this is just residual from the Covid infection.  We also presumptively felt that she had Covid in the beginning though she did test negative.  She was tested again yesterday we will see what her result is.  In the meantime for her headaches and her joint pain we will try her on prednisone taper.  She has not given any red flags such as loss of vision change in speech denied any new tingling or numbness.  She will also continue her regular allergy medications.  If she does get worsening productive cough sinus drainage consistent with her previous sinus infections and I will add antibiotic.  With regards to her work if her Covid test does come back negative again I think that she can go back to work on Monday if her test is positive I would keep her out another 14 days per her job she needs to have a negative test in order to return  Follow Up Instructions:    I discussed the assessment and treatment plan with the patient. The patient was provided an opportunity to ask questions and all were answered. The patient agreed with the plan and demonstrated an understanding of the instructions.   The patient was advised to call back or seek an in-person evaluation if the symptoms worsen or if the condition fails to improve as anticipated.  I provided 14  minutes of  non-face-to-face time during this encounter. End Time: 8:20am  Vic Blackbird, MD

## 2019-07-25 LAB — NOVEL CORONAVIRUS, NAA: SARS-CoV-2, NAA: NOT DETECTED

## 2019-07-30 ENCOUNTER — Encounter: Payer: Self-pay | Admitting: Family Medicine

## 2019-08-09 NOTE — Progress Notes (Signed)
[facility] Note  08/10/2019     CHIEF COMPLAINT Patient presents for Retina Evaluation   HISTORY OF PRESENT ILLNESS: Cheyenne Hanson is a 63 y.o. female who presents to the clinic today for:   HPI    Retina Evaluation    In left eye.  Onset: unknown.  Duration: unknown.  Associated Symptoms Flashes and Floaters.  Negative for Distortion, Pain, Photophobia, Trauma, Jaw Claudication, Fever, Fatigue, Blind Spot, Redness, Glare, Scalp Tenderness, Shoulder/Hip pain and Weight Loss.  Context:  distance vision, mid-range vision and near vision.  Treatments tried include no treatments.  I, the attending physician,  performed the HPI with the patient and updated documentation appropriately.          Comments    Patient referred by Dr. Shirley Muscat for possible retinal tear OS. Patient has floaters and flashes OU, but these symptoms are not new or worse than before. Has seen Dr. Coralyn Pear in the past for chorioretinal atrophy OS.        Last edited by Bernarda Caffey, MD on 08/12/2019  3:07 PM. (History)    Pt presents back on the referral of Dr. Shirley Muscat who she saw yesterday, for possible retinal tear OS, pt states she is doing well and has no complaints, pt states she was supposed to follow up here in the summer, but didn't, do to her husband being sick  Referring physician: Calton Dach, MD [place],  [place] [address]  HISTORICAL INFORMATION:   Selected notes from the [facility] Referred by Dr. Renaldo Harrison for concern of retinal hole OS  LEE- 12.21.18 (S. Bernstorf) [BCVA OD: 20/20 OS: 20/50] Ocular Hx- ocular migraine  PMH- elevated cholesterol, migraines     CURRENT MEDICATIONS: No current outpatient medications on file. (Ophthalmic Drugs)   No current facility-administered medications for this visit. (Ophthalmic Drugs)   Current Outpatient Medications (Other)  Medication Sig  . amoxicillin-clavulanate (AUGMENTIN)  875-125 MG tablet Take 1 tablet by mouth 2 (two) times daily.  Marland Kitchen azelastine (ASTELIN) 0.1 % nasal spray PLACE 2 SPRAYS INTO BOTH NOSTRILS 2 (TWO) TIMES DAILY.  . clonazePAM (KLONOPIN) 0.5 MG tablet Take 1 tablet (0.5 mg total) by mouth at bedtime as needed for anxiety.  Marland Kitchen ezetimibe (ZETIA) 10 MG tablet TAKE 1 TABLET BY MOUTH EVERY DAY IN THE EVENING  . loratadine (CLARITIN) 10 MG tablet Take 10 mg by mouth daily.  . montelukast (SINGULAIR) 10 MG tablet Take 1 tablet (10 mg total) by mouth at bedtime.  Marland Kitchen guaiFENesin-codeine 100-10 MG/5ML syrup Take 5 mLs by mouth every 6 (six) hours as needed. (Patient not taking: Reported on 08/10/2019)  . predniSONE (DELTASONE) 10 MG tablet Take 40mg  x 3 days,20mg  x 3 days, 10mg  x 3 days (Patient not taking: Reported on 08/10/2019)   No current facility-administered medications for this visit. (Other)      REVIEW OF SYSTEMS: ROS    Positive for: Eyes, Psychiatric   Negative for: Constitutional, Gastrointestinal, Neurological, Skin, Genitourinary, Musculoskeletal, HENT, Endocrine, Cardiovascular, Respiratory, Allergic/Imm, Heme/Lymph   Last edited by Roselee Nova D, COT on 08/10/2019  9:21 AM. (History)       ALLERGIES Allergies  Allergen Reactions  . Latex Other (See Comments)    Blisters if she wears the gloves  . Statins     Muscle aches    PAST MEDICAL HISTORY Past Medical History:  Diagnosis Date  . Anemia   . Anxiety    uses  klonopin sparingly (30 lasts a year)  . Basal cell carcinoma    "face, back, chest" (07/21/2017)  . CAP (community acquired pneumonia) 07/20/2017  . GERD (gastroesophageal reflux disease)   . Headache    "maybe weekly; more w/seasonal allergies" (07/21/2017)  . Hyperlipidemia   . Mitral valve prolapse   . OSA on CPAP    cpap since 2008  . Pneumonia 1990s X 1  . Seasonal allergies    Past Surgical History:  Procedure Laterality Date  . ANTERIOR CERVICAL DECOMP/DISCECTOMY FUSION  ?2010    Dr. Ronnald Ramp  .  ARTHROSCOPIC REPAIR ACL Left 1997  . BACK SURGERY    . BASAL CELL CARCINOMA EXCISION     "face, back, chest" (07/21/2017)    FAMILY HISTORY Family History  Problem Relation Age of Onset  . Diabetes Mother   . Cancer Father        lung  . Stroke Sister   . Hyperlipidemia Sister   . Diabetes Sister   . Heart disease Brother   . Hyperlipidemia Brother   . Breast cancer Cousin   . Diabetes Cousin   . Diabetes Maternal Grandmother   . Amblyopia Neg Hx   . Blindness Neg Hx   . Cataracts Neg Hx   . Glaucoma Neg Hx   . Macular degeneration Neg Hx   . Retinal detachment Neg Hx   . Strabismus Neg Hx   . Retinitis pigmentosa Neg Hx     SOCIAL HISTORY Social History   Tobacco Use  . Smoking status: Never Smoker  . Smokeless tobacco: Never Used  Substance Use Topics  . Alcohol use: Yes    Alcohol/week: 2.0 standard drinks    Types: 2 Glasses of wine per week  . Drug use: No         OPHTHALMIC EXAM:  Base Eye Exam    Visual Acuity (Snellen - Linear)      Right Left   Dist cc 20/20 20/20   Correction: Glasses       Tonometry (Tonopen, 9:28 AM)      Right Left   Pressure 19 19       Pupils      Dark Light Shape React APD   Right 4 2 Round Brisk None   Left 4 2 Round Brisk None       Visual Fields (Counting fingers)      Left Right    Full Full       Extraocular Movement      Right Left    Full, Ortho Full, Ortho       Neuro/Psych    Oriented x3: Yes   Mood/Affect: Normal       Dilation    Both eyes: 1.0% Mydriacyl, 2.5% Phenylephrine @ 9:28 AM        Slit Lamp and Fundus Exam    Slit Lamp Exam      Right Left   Lids/Lashes Dermatochalasis - upper lid Dermatochalasis - upper lid   Conjunctiva/Sclera White and quiet White and quiet   Cornea 2-3+ Punctate epithelial erosions, dry tear film, irregular epi inferiorly Inferior 1+ Punctate epithelial erosions, dry tear film   Anterior Chamber Deep and quiet Deep and quiet   Iris Round and dilated  Round and dilated   Lens 1-22+ Nuclear sclerosis, 1-22+ Cortical cataract 2+ Nuclear sclerosis, 2+ Cortical cataract   Vitreous Vitreous syneresis, Posterior vitreous detachment Vitreous syneresis, Posterior vitreous detachment       Fundus Exam  Right Left   Disc Pink and Sharp, Compact Pink and Sharp   C/D Ratio 0.3 0.45   Macula Flat, Blunted foveal reflex, mild Retinal pigment epithelial mottling, No heme or edema Flat, mild Retinal pigment epithelial mottling, No heme or edema   Vessels Normal Normal   Periphery Attached, very mild scattered reticular changes Attached, small round area of chorioretinal atrophy at 0500 mid-zone -- pavingstone degeneration, mild scattered reticular changes        Refraction    Wearing Rx      Sphere Cylinder Axis Add   Right -1.75 +1.50 025 +2.00   Left -2.00 +1.75 015 +2.00          IMAGING AND PROCEDURES  Imaging and Procedures for 09/07/17  OCT, Retina - OU - Both Eyes       Right Eye Quality was good. Central Foveal Thickness: 303. Progression has been stable. Findings include normal foveal contour, no IRF, no SRF.   Left Eye Quality was good. Central Foveal Thickness: 296. Progression has been stable. Findings include normal foveal contour, no IRF, no SRF.   Notes *Images captured and stored on drive  Diagnosis / Impression:  NFP; no IRF/SRF OU  Clinical management:  See below  Abbreviations: NFP - Normal foveal profile. CME - cystoid macular edema. PED - pigment epithelial detachment. IRF - intraretinal fluid. SRF - subretinal fluid. EZ - ellipsoid zone. ERM - epiretinal membrane. ORA - outer retinal atrophy. ORT - outer retinal tubulation. SRHM - subretinal hyper-reflective material                 ASSESSMENT/PLAN:    ICD-10-CM   1. 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 Retinal edema  H35.81 OCT, Retina - OU - Both Eyes  4.  Posterior vitreous detachment of both eyes  H43.813   5. Combined form of age-related cataract, both eyes  H25.813   6. Dry eyes  H04.123     1,2. Chorioretinal atrophy OS  Round focal area of chorioretinal atrophy / paving stone degeneration, 5 oclock midzone -- stable / no change  No retinal tear, subretinal fluid or RD  Monitor  F/u 1-2 years, sooner prn  3. No retinal edema on exam or OCT  4. PVD / vitreous syneresis OU-   Discussed findings and prognosis  No RT or RD on 360 peripheral exam  Reviewed s/s of RT/RD  Strict return precautions for any such RT/RD signs/symptoms  5. Combined form of age-related cataract OU-   - The symptoms of cataract, surgical options, and treatments and risks were discussed with patient.  - discussed diagnosis and progression  - not yet visually significant  - monitor for now  6. DES OU-   - signfiicant PEE OU  - recommend artificial tears and lubricating ointment as needed   Ophthalmic Meds Ordered this visit:  No orders of the defined types were placed in this encounter.     Return for f/u 1-2 years CR atrophy OS, DFE, OCT, OPTOS colors.   Gardiner Sleeper, M.D., Ph.D. Diseases & Surgery of the Retina and [place] 08/10/2019   I have reviewed the above documentation for accuracy and completeness, and I agree with the above. Gardiner Sleeper, M.D., Ph.D. 08/12/19 3:10 PM

## 2019-08-10 ENCOUNTER — Ambulatory Visit (INDEPENDENT_AMBULATORY_CARE_PROVIDER_SITE_OTHER): Payer: BC Managed Care – PPO | Admitting: Ophthalmology

## 2019-08-10 ENCOUNTER — Other Ambulatory Visit: Payer: Self-pay

## 2019-08-10 ENCOUNTER — Encounter (INDEPENDENT_AMBULATORY_CARE_PROVIDER_SITE_OTHER): Payer: Self-pay | Admitting: Ophthalmology

## 2019-08-10 DIAGNOSIS — H31102 Choroidal degeneration, unspecified, left eye: Secondary | ICD-10-CM | POA: Diagnosis not present

## 2019-08-10 DIAGNOSIS — H3581 Retinal edema: Secondary | ICD-10-CM | POA: Diagnosis not present

## 2019-08-10 DIAGNOSIS — H25813 Combined forms of age-related cataract, bilateral: Secondary | ICD-10-CM

## 2019-08-10 DIAGNOSIS — H43813 Vitreous degeneration, bilateral: Secondary | ICD-10-CM | POA: Diagnosis not present

## 2019-08-10 DIAGNOSIS — H04123 Dry eye syndrome of bilateral lacrimal glands: Secondary | ICD-10-CM

## 2019-08-10 DIAGNOSIS — H35432 Paving stone degeneration of retina, left eye: Secondary | ICD-10-CM | POA: Diagnosis not present

## 2019-08-12 ENCOUNTER — Encounter (INDEPENDENT_AMBULATORY_CARE_PROVIDER_SITE_OTHER): Payer: Self-pay | Admitting: Ophthalmology

## 2019-10-07 ENCOUNTER — Ambulatory Visit: Payer: BC Managed Care – PPO | Attending: Internal Medicine

## 2019-10-07 DIAGNOSIS — Z23 Encounter for immunization: Secondary | ICD-10-CM

## 2019-10-07 NOTE — Progress Notes (Signed)
   Covid-19 Vaccination Clinic  Name:  Cheyenne Hanson    MRN: [id number] DOB: 09-26-1955  10/07/2019  [name] was observed post Covid-19 immunization for 15 minutes without incidence. She was provided with Vaccine Information Sheet and instruction to access the V-Safe system.   [name] was instructed to call 911 with any severe reactions post vaccine: Marland Kitchen Difficulty breathing  . Swelling of your face and throat  . A fast heartbeat  . A bad rash all over your body  . Dizziness and weakness    Immunizations Administered    Name Date Dose VIS Date Route   Pfizer COVID-19 Vaccine 10/07/2019  9:12 AM 0.3 mL 08/03/2019 Intramuscular   Manufacturer: [place]   Lot: [id number]   [place]: [id number]

## 2019-10-22 HISTORY — PX: MELANOMA EXCISION: SHX5266

## 2019-10-27 ENCOUNTER — Ambulatory Visit: Payer: BLUE CROSS/BLUE SHIELD

## 2019-10-31 ENCOUNTER — Ambulatory Visit: Payer: BC Managed Care – PPO | Attending: Internal Medicine

## 2019-10-31 DIAGNOSIS — Z23 Encounter for immunization: Secondary | ICD-10-CM

## 2019-10-31 IMAGING — CT CT CHEST W/O CM
1 series · 16 of 34 positions shown, 20 images · non-contrast
Comparison: 07/13/2017 chest radiograph

CLINICAL DATA: 61 y/o F; left chest pain, cough and fever with
pneumonia.

EXAM:
CT CHEST WITHOUT CONTRAST
TECHNIQUE: Multidetector CT imaging of the chest was performed following the
standard protocol without IV contrast.

[Series 2: chest w/(date) · axial · 0.74mm/px · z∈[-376,-62]mm · 16 of 178 slices shown, 20 images]
[im 14/178  mediastinal]
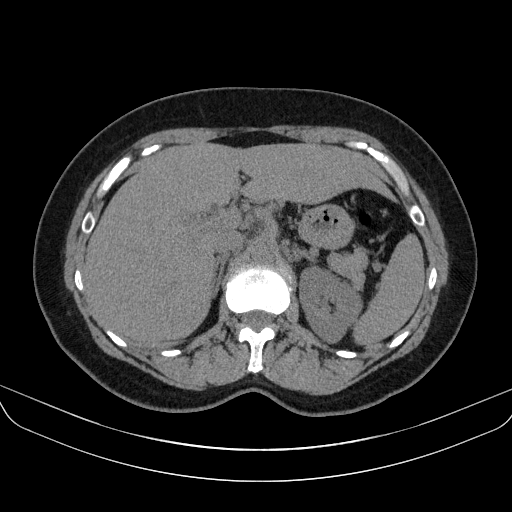
[im 14/178  lung]
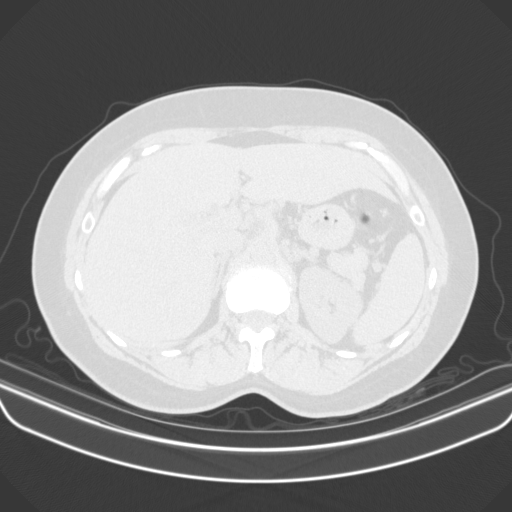
[im 27/178  lung]
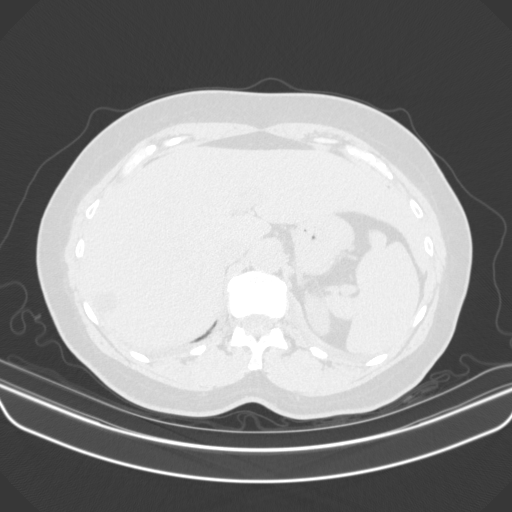
[im 36/178  lung]
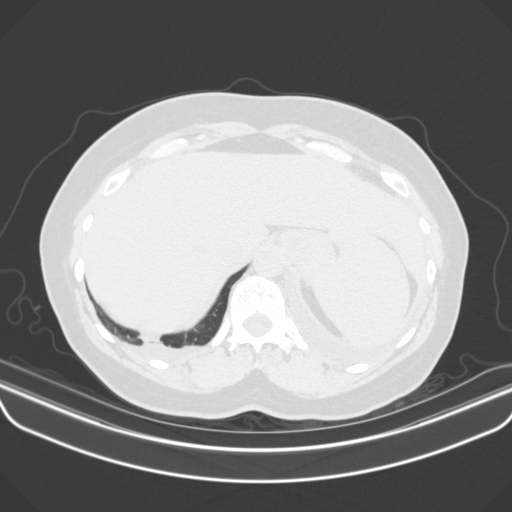
[im 46/178  lung]
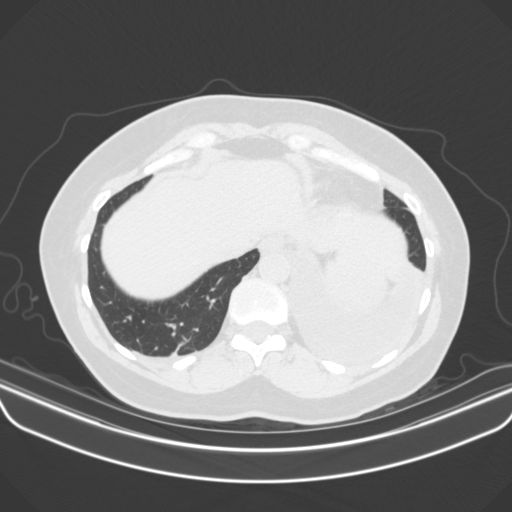
[im 60/178  mediastinal]
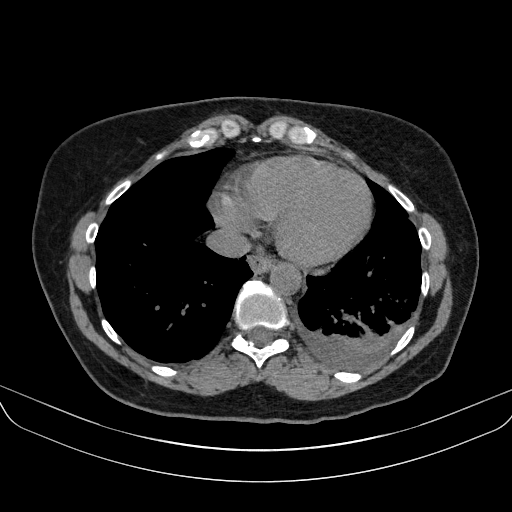
[im 60/178  lung]
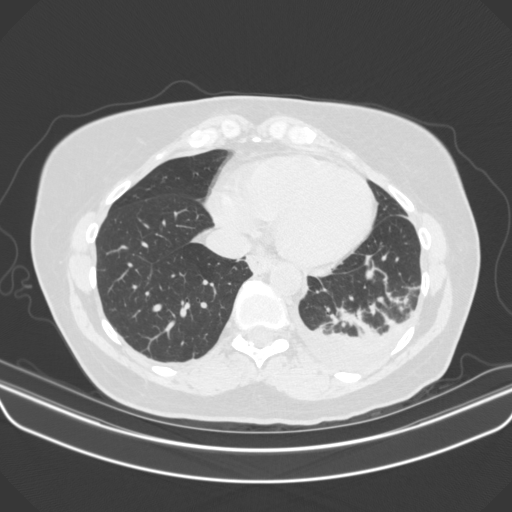
[im 71/178  lung]
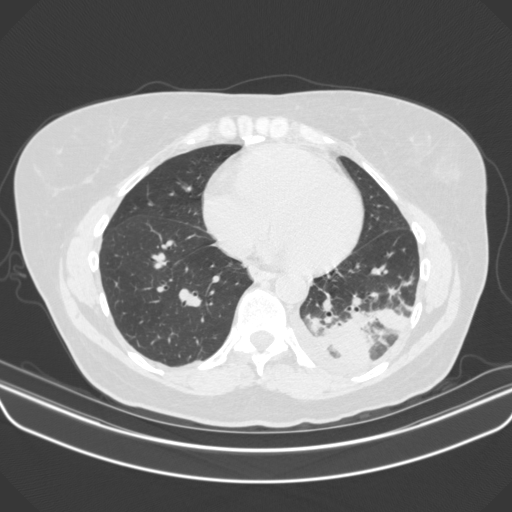
[im 79/178  lung]
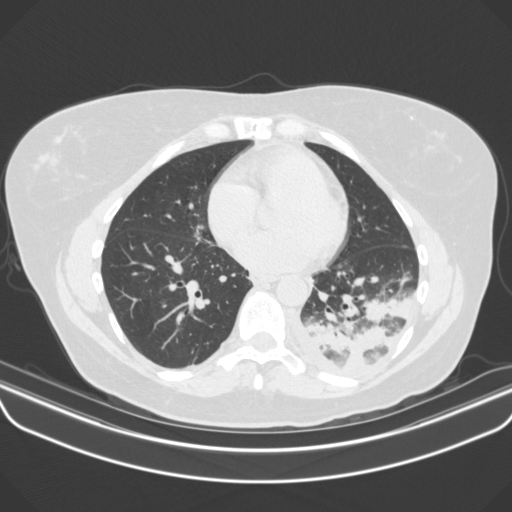
[im 86/178  lung]
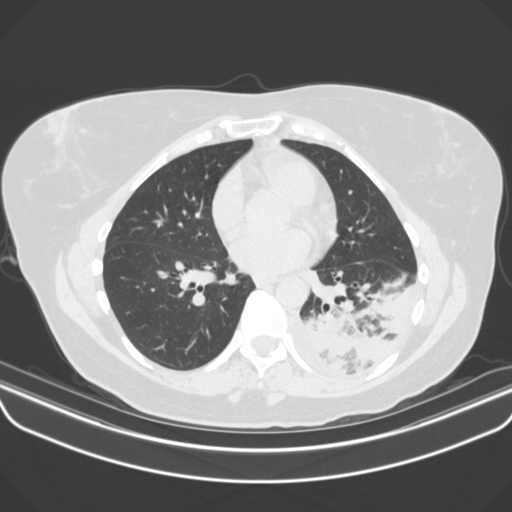
[im 94/178  mediastinal]
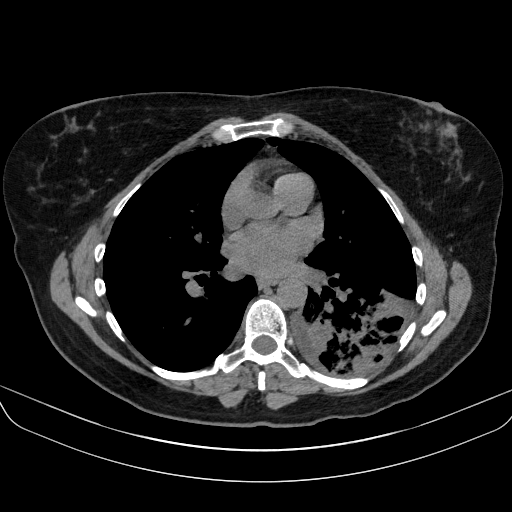
[im 94/178  lung]
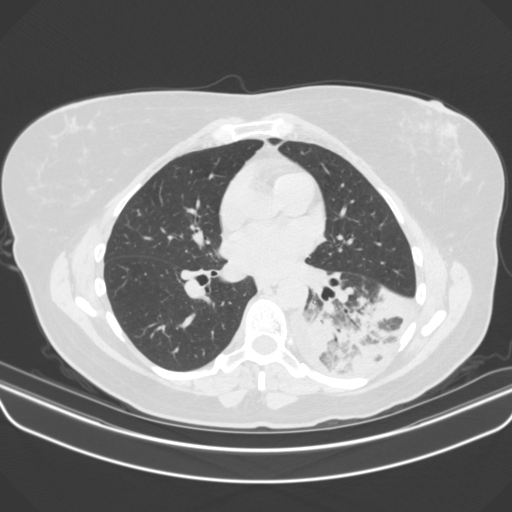
[im 105/178  lung]
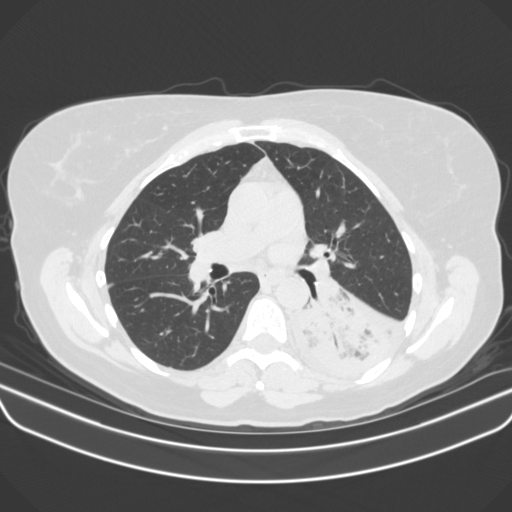
[im 112/178  lung]
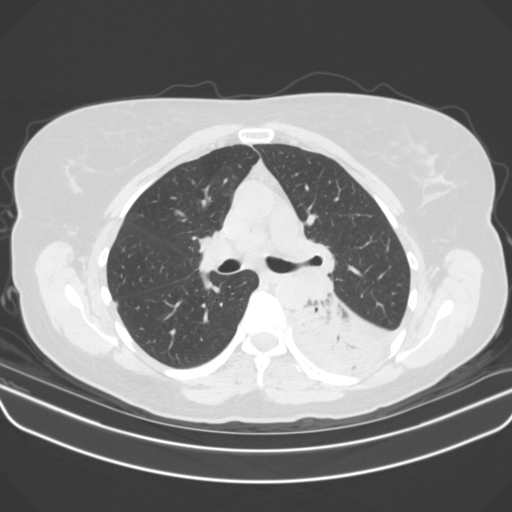
[im 125/178  lung]
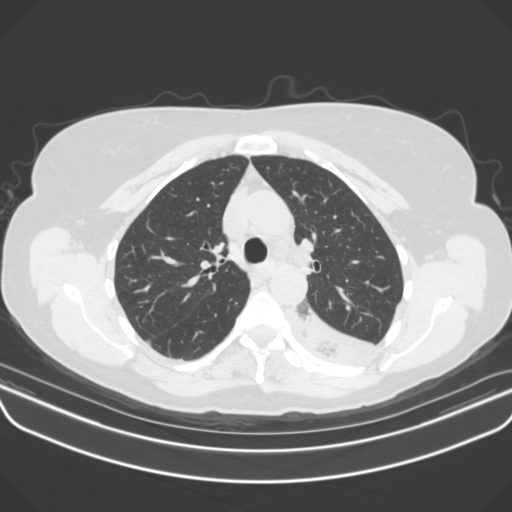
[im 138/178  mediastinal]
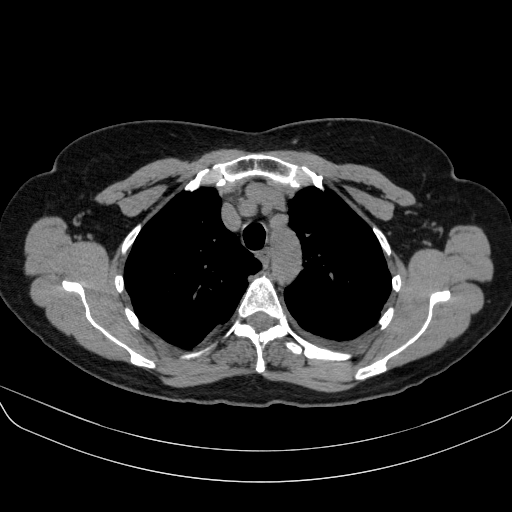
[im 138/178  lung]
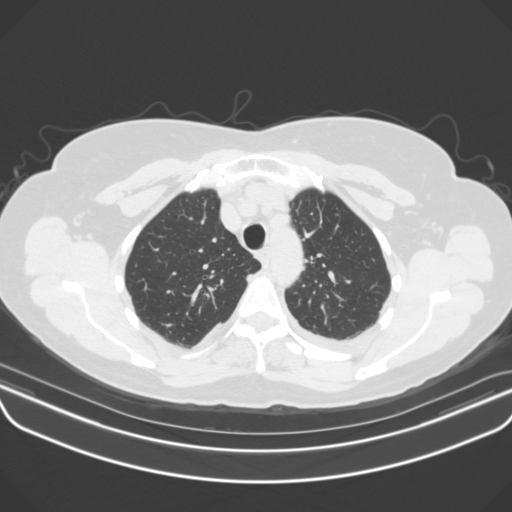
[im 145/178  lung]
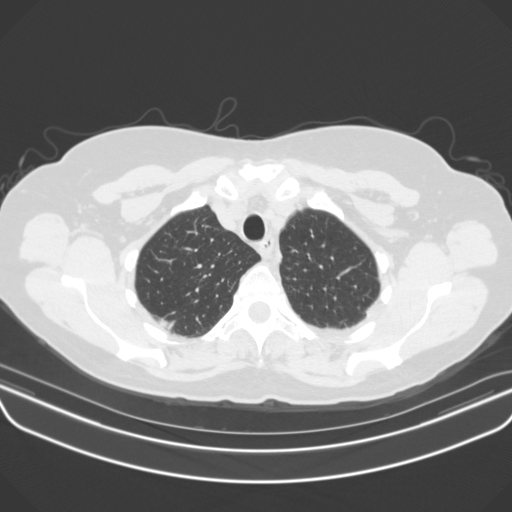
[im 158/178  lung]
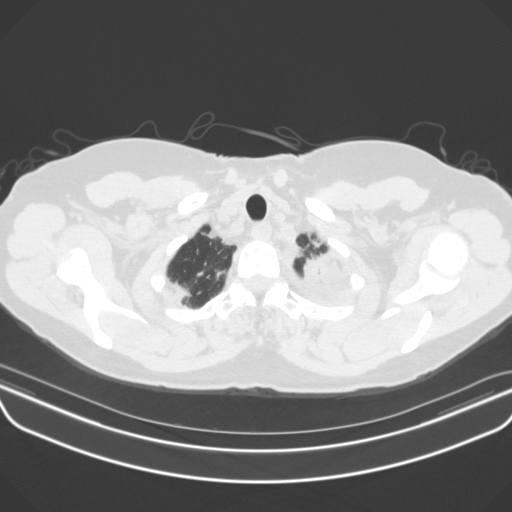
[im 171/178  lung]
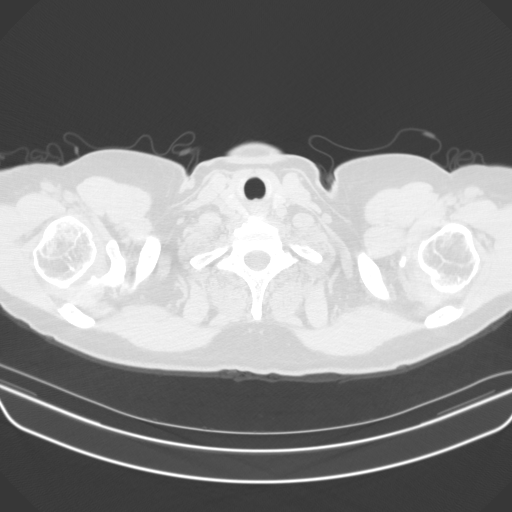

[16 of 34 positions shown; findings below may reference images not displayed]

FINDINGS: Cardiovascular: No significant vascular findings. Normal heart size.
No pericardial effusion.

Mediastinum/Nodes: Left lower paratracheal lymphadenopathy, likely
reactive. Normal thyroid gland. Normal thoracic esophagus.

Lungs/Pleura: Left lower lobe consolidation and small left effusion
are increased from prior radiograph. Biapical pleuroparenchymal
scarring.

Upper Abdomen: Right lobe of liver 24 mm cyst and subcentimeter
lucency in the dome a right liver, too small to characterize, but
likely additional cysts.

Musculoskeletal: No chest wall mass or suspicious bone lesions
identified.
IMPRESSION: Left lower lobe pneumonia and small left pleural effusion increased
from prior radiographs.

By: Lee Meng Ngang M.D.

## 2019-10-31 NOTE — Progress Notes (Signed)
   Covid-19 Vaccination Clinic  Name:  Cheyenne Hanson    MRN: [id number] DOB: 14-Jul-1956  10/31/2019  [name] was observed post Covid-19 immunization for 15 minutes without incident. She was provided with Vaccine Information Sheet and instruction to access the V-Safe system.   [name] was instructed to call 911 with any severe reactions post vaccine: Marland Kitchen Difficulty breathing  . Swelling of face and throat  . A fast heartbeat  . A bad rash all over body  . Dizziness and weakness   Immunizations Administered    Name Date Dose VIS Date Route   Pfizer COVID-19 Vaccine 10/31/2019  3:35 PM 0.3 mL 08/03/2019 Intramuscular   Manufacturer: [place]   Lot: [id number]   [place]: [id number]

## 2020-04-12 ENCOUNTER — Ambulatory Visit
Admission: EM | Admit: 2020-04-12 | Discharge: 2020-04-12 | Disposition: A | Discharge status: Home / Self Care | Payer: BC Managed Care – PPO | Attending: Physician Assistant | Admitting: Physician Assistant

## 2020-04-12 ENCOUNTER — Other Ambulatory Visit: Payer: Self-pay

## 2020-04-12 DIAGNOSIS — R0981 Nasal congestion: Secondary | ICD-10-CM

## 2020-04-12 DIAGNOSIS — J3489 Other specified disorders of nose and nasal sinuses: Secondary | ICD-10-CM | POA: Diagnosis not present

## 2020-04-12 MED ORDER — AMOXICILLIN-POT CLAVULANATE 875-125 MG PO TABS: 1.0000 | ORAL_TABLET | Freq: Two times a day (BID) | ORAL | 0 refills | Status: DC

## 2020-04-12 MED ORDER — PREDNISONE 50 MG PO TABS: 50.0000 mg | ORAL_TABLET | Freq: Every day | ORAL | 0 refills | Status: DC

## 2020-04-12 MED ORDER — FLUCONAZOLE 150 MG PO TABS: 150.0000 mg | ORAL_TABLET | Freq: Every day | ORAL | 0 refills | Status: DC

## 2020-04-12 NOTE — Discharge Instructions (Signed)
COVID PCR testing ordered. I would like you to quarantine until testing results. Start augmentin to cover for bacterial sinus infection/left ear infection. Prednisone as directed. As discussed you can trial short course of afrin at night for 1-3 days to allow azelastine to sufficiently go to the sinus. Tylenol/motrin for pain and fever. Keep hydrated, urine should be clear to pale yellow in color. If experiencing shortness of breath, trouble breathing, go to the emergency department for further evaluation needed.

## 2020-04-12 NOTE — ED Triage Notes (Signed)
Patient presents with nasal congestion (yellow), headache.  Symptoms initially started x 5 days ago with scratchy throat.

## 2020-04-12 NOTE — ED Provider Notes (Signed)
[facility]    CSN: [id number] Arrival date & time: 04/12/20  0913      History   Chief Complaint Chief Complaint  Patient presents with  . Nasal Congestion  . Headache    HPI Cheyenne Hanson is a 64 y.o. female.   63 year old female comes in for 5 day of URI symptoms. Nasal congestion, headache, sore throat. Minimal cough. Denies fever, chills, body aches. Denies abdominal pain, nausea, vomiting, diarrhea. Denies shortness of breath, loss of taste/smell. Fully COVID vaccinated.      Past Medical History:  Diagnosis Date  . Anemia   . Anxiety    uses klonopin sparingly (30 lasts a year)  . Basal cell carcinoma    "face, back, chest" (07/21/2017)  . CAP (community acquired pneumonia) 07/20/2017  . GERD (gastroesophageal reflux disease)   . Headache    "maybe weekly; more w/seasonal allergies" (07/21/2017)  . Hyperlipidemia   . Mitral valve prolapse   . OSA on CPAP    cpap since 2008  . Pneumonia 1990s X 1  . Seasonal allergies     Patient Active Problem List   Diagnosis Date Noted  . GAD (generalized anxiety disorder) 08/26/2018  . HLD (hyperlipidemia) 07/21/2017    Past Surgical History:  Procedure Laterality Date  . ANTERIOR CERVICAL DECOMP/DISCECTOMY FUSION  ?2010    Dr. Ronnald Ramp  . ARTHROSCOPIC REPAIR ACL Left 1997  . BACK SURGERY    . BASAL CELL CARCINOMA EXCISION     "face, back, chest" (07/21/2017)    OB History   No obstetric history on file.      Home Medications    Prior to Admission medications   Medication Sig Start Date End Date Taking? Authorizing Provider  amoxicillin-clavulanate (AUGMENTIN) 875-125 MG tablet Take 1 tablet by mouth every 12 (twelve) hours. 04/12/20   Tasia Catchings, Bena Kobel V, PA-C  azelastine (ASTELIN) 0.1 % nasal spray PLACE 2 SPRAYS INTO BOTH NOSTRILS 2 (TWO) TIMES DAILY. 08/25/18   [place] Path, Modena Nunnery, MD  clonazePAM (KLONOPIN) 0.5 MG tablet Take 1 tablet (0.5 mg total) by mouth at bedtime as needed for anxiety. 08/25/18    Alycia Rossetti, MD  ezetimibe (ZETIA) 10 MG tablet TAKE 1 TABLET BY MOUTH EVERY DAY IN THE EVENING 07/11/19   Susy Frizzle, MD  fluconazole (DIFLUCAN) 150 MG tablet Take 1 tablet (150 mg total) by mouth daily. Take second dose 72 hours later if symptoms still persists. 04/12/20   Tasia Catchings, Jaire Pinkham V, PA-C  guaiFENesin-codeine 100-10 MG/5ML syrup Take 5 mLs by mouth every 6 (six) hours as needed. Patient not taking: Reported on 08/10/2019 08/25/18   Alycia Rossetti, MD  loratadine (CLARITIN) 10 MG tablet Take 10 mg by mouth daily.    [provider]  montelukast (SINGULAIR) 10 MG tablet Take 1 tablet (10 mg total) by mouth at bedtime. 08/25/18   Alycia Rossetti, MD  predniSONE (DELTASONE) 50 MG tablet Take 1 tablet (50 mg total) by mouth daily with breakfast. 04/12/20   Ok Edwards, PA-C    Family History Family History  Problem Relation Age of Onset  . Diabetes Mother   . Cancer Father        lung  . Stroke Sister   . Hyperlipidemia Sister   . Diabetes Sister   . Heart disease Brother   . Hyperlipidemia Brother   . Breast cancer Cousin   . Diabetes Cousin   . Diabetes Maternal Grandmother   . Amblyopia  Neg Hx   . Blindness Neg Hx   . Cataracts Neg Hx   . Glaucoma Neg Hx   . Macular degeneration Neg Hx   . Retinal detachment Neg Hx   . Strabismus Neg Hx   . Retinitis pigmentosa Neg Hx     Social History Social History   Tobacco Use  . Smoking status: Never Smoker  . Smokeless tobacco: Never Used  Vaping Use  . Vaping Use: Never used  Substance Use Topics  . Alcohol use: Yes    Alcohol/week: 2.0 standard drinks    Types: 2 Glasses of wine per week  . Drug use: No     Allergies   Latex and Statins   Review of Systems Review of Systems  Reason unable to perform ROS: See HPI as above.     Physical Exam Triage Vital Signs ED Triage Vitals  Enc Vitals Group     BP 04/12/20 1036 (!) 144/82     Pulse Rate 04/12/20 1036 80     Resp 04/12/20 1036 14     Temp  04/12/20 1036 97.9 F (36.6 C)     Temp Source 04/12/20 1036 Oral     SpO2 04/12/20 1036 97 %     Weight --      Height --      Head Circumference --      Peak Flow --      Pain Score 04/12/20 1037 4     Pain Loc --      Pain Edu? --      Excl. in [place]? --    No data found.  Updated Vital Signs BP (!) 144/82 (BP Location: Left Arm)   Pulse 80   Temp 97.9 F (36.6 C) (Oral)   Resp 14   SpO2 97%   Physical Exam Constitutional:      General: She is not in acute distress.    Appearance: Normal appearance. She is well-developed. She is not ill-appearing, toxic-appearing or diaphoretic.  HENT:     Head: Normocephalic and atraumatic.     Right Ear: Tympanic membrane, ear canal and external ear normal. Tympanic membrane is not erythematous or bulging.     Left Ear: Ear canal and external ear normal. Tympanic membrane is erythematous and bulging.     Nose:     Right Sinus: Frontal sinus tenderness present. No maxillary sinus tenderness.     Left Sinus: Frontal sinus tenderness present. No maxillary sinus tenderness.     Mouth/Throat:     Mouth: Mucous membranes are moist.     Pharynx: Oropharynx is clear. Uvula midline.  Eyes:     Conjunctiva/sclera: Conjunctivae normal.     Pupils: Pupils are equal, round, and reactive to light.  Cardiovascular:     Rate and Rhythm: Normal rate and regular rhythm.  Pulmonary:     Effort: Pulmonary effort is normal. No accessory muscle usage, prolonged expiration, respiratory distress or retractions.     Breath sounds: No decreased air movement or transmitted upper airway sounds. No decreased breath sounds.     Comments: LCTAB Musculoskeletal:     Cervical back: Normal range of motion and neck supple.  Skin:    General: Skin is warm and dry.  Neurological:     Mental Status: She is alert and oriented to person, place, and time.      UC Treatments / Results  Labs (all labs ordered are listed, but only abnormal results are  displayed) Labs Reviewed  NOVEL CORONAVIRUS, NAA    EKG   Radiology No results found.  Procedures Procedures (including critical care time)  Medications Ordered in UC Medications - No data to display  Initial Impression / Assessment and Plan / UC Course  I have reviewed the triage vital signs and the nursing notes.  Pertinent labs & imaging results that were available during my care of the patient were reviewed by me and considered in my medical decision making (see chart for details).    Covid testing ordered, patient to quarantine until testing results return.  Left ear otitis media with frontal pressure, will cover for bacterial sinusitis with Augmentin.  Discussed this could still be a viral illness given timeline, continue symptomatic treatment, prednisone.  Return precautions given.  Final Clinical Impressions(s) / UC Diagnoses   Final diagnoses:  Sinus pressure  Nasal congestion   ED Prescriptions    Medication Sig Dispense Auth. Provider   amoxicillin-clavulanate (AUGMENTIN) 875-125 MG tablet Take 1 tablet by mouth every 12 (twelve) hours. 14 tablet Latresha Yahr V, PA-C   fluconazole (DIFLUCAN) 150 MG tablet Take 1 tablet (150 mg total) by mouth daily. Take second dose 72 hours later if symptoms still persists. 2 tablet Morgan Keinath V, PA-C   predniSONE (DELTASONE) 50 MG tablet Take 1 tablet (50 mg total) by mouth daily with breakfast. 5 tablet Ok Edwards, PA-C     PDMP not reviewed this encounter.   Ok Edwards, PA-C 04/12/20 1107

## 2020-04-13 LAB — SARS-COV-2, NAA 2 DAY TAT

## 2020-04-13 LAB — NOVEL CORONAVIRUS, NAA: SARS-CoV-2, NAA: NOT DETECTED

## 2020-06-15 ENCOUNTER — Other Ambulatory Visit: Payer: Self-pay | Admitting: Family Medicine

## 2020-06-23 ENCOUNTER — Ambulatory Visit (INDEPENDENT_AMBULATORY_CARE_PROVIDER_SITE_OTHER): Payer: BC Managed Care – PPO | Admitting: Family Medicine

## 2020-06-23 ENCOUNTER — Other Ambulatory Visit: Payer: Self-pay

## 2020-06-23 DIAGNOSIS — Z23 Encounter for immunization: Secondary | ICD-10-CM | POA: Diagnosis not present

## 2020-06-24 ENCOUNTER — Encounter: Payer: Self-pay | Admitting: Family Medicine

## 2020-06-24 NOTE — Progress Notes (Signed)
Patient here for COVID19 vaccination only.  I did not examine the patient.  I did review his medical history, medications, and allergies and vaccine consent form.  CMA gave vaccination. Patient tolerated well.  Virginia Crews, MD, MPH [facility] 06/24/2020 1:12 PM

## 2020-08-07 NOTE — Progress Notes (Signed)
[facility] Note  08/11/2020     CHIEF COMPLAINT Patient presents for Retina Follow Up   HISTORY OF PRESENT ILLNESS: Cheyenne Hanson is a 64 y.o. female who presents to the clinic today for:   HPI    Retina Follow Up    Patient presents with  Other.  In left eye.  Duration of 1 year.  I, the attending physician,  performed the HPI with the patient and updated documentation appropriately.          Comments    1 year follow up chorioretinal atrophy OS-  Appears to have an increase in floaters OS x3 months.  Some FOLs in the dark or low light.  She has noticed more glare when driving.           Last edited by Bernarda Caffey, MD on 08/11/2020 12:49 PM. (History)    Pt has an appt with Dr. Midge Aver on December 29, pt states her left eye is more blurry and has more glare now than it did last year  Referring physician: Warden Fillers, [address],  [place] [id number]  HISTORICAL INFORMATION:   Selected notes from the MEDICAL RECORD NUMBER Referred by Dr. Renaldo Harrison for concern of retinal hole OS  LEE- 12.21.18 (S. Bernstorf) [BCVA OD: 20/20 OS: 20/50] Ocular Hx- ocular migraine  PMH- elevated cholesterol, migraines     CURRENT MEDICATIONS: No current outpatient medications on file. (Ophthalmic Drugs)   No current facility-administered medications for this visit. (Ophthalmic Drugs)   Current Outpatient Medications (Other)  Medication Sig  . azelastine (ASTELIN) 0.1 % nasal spray PLACE 2 SPRAYS INTO BOTH NOSTRILS 2 (TWO) TIMES DAILY.  . clonazePAM (KLONOPIN) 0.5 MG tablet Take 1 tablet (0.5 mg total) by mouth at bedtime as needed for anxiety.  Marland Kitchen ezetimibe (ZETIA) 10 MG tablet TAKE 1 TABLET BY MOUTH EVERY DAY IN THE EVENING  . loratadine (CLARITIN) 10 MG tablet Take 10 mg by mouth daily.  Marland Kitchen amoxicillin-clavulanate (AUGMENTIN) 875-125 MG tablet Take 1 tablet by mouth every 12 (twelve) hours. (Patient not taking:  Reported on 08/11/2020)  . fluconazole (DIFLUCAN) 150 MG tablet Take 1 tablet (150 mg total) by mouth daily. Take second dose 72 hours later if symptoms still persists. (Patient not taking: Reported on 08/11/2020)  . guaiFENesin-codeine 100-10 MG/5ML syrup Take 5 mLs by mouth every 6 (six) hours as needed. (Patient not taking: No sig reported)  . montelukast (SINGULAIR) 10 MG tablet Take 1 tablet (10 mg total) by mouth at bedtime. (Patient not taking: Reported on 08/11/2020)  . predniSONE (DELTASONE) 50 MG tablet Take 1 tablet (50 mg total) by mouth daily with breakfast. (Patient not taking: Reported on 08/11/2020)   No current facility-administered medications for this visit. (Other)      REVIEW OF SYSTEMS: ROS    Positive for: Skin, Eyes, Respiratory, Psychiatric   Negative for: Constitutional, Gastrointestinal, Neurological, Genitourinary, Musculoskeletal, HENT, Endocrine, Cardiovascular, Allergic/Imm, Heme/Lymph   Last edited by Leonie Douglas, COA on 08/11/2020 10:13 AM. (History)       ALLERGIES Allergies  Allergen Reactions  . Latex Other (See Comments)    Blisters if she wears the gloves  . Statins     Muscle aches    PAST MEDICAL HISTORY Past Medical History:  Diagnosis Date  . Anemia   . Anxiety    uses klonopin sparingly (30 lasts a year)  . Basal cell carcinoma    "  face, back, chest" (07/21/2017)  . CAP (community acquired pneumonia) 07/20/2017  . GERD (gastroesophageal reflux disease)   . Headache    "maybe weekly; more w/seasonal allergies" (07/21/2017)  . Hyperlipidemia   . Mitral valve prolapse   . OSA on CPAP    cpap since 2008  . Pneumonia 1990s X 1  . Seasonal allergies    Past Surgical History:  Procedure Laterality Date  . ANTERIOR CERVICAL DECOMP/DISCECTOMY FUSION  ?2010    Dr. Ronnald Ramp  . ARTHROSCOPIC REPAIR ACL Left 1997  . BACK SURGERY    . BASAL CELL CARCINOMA EXCISION     "face, back, chest" (07/21/2017)  . MELANOMA EXCISION  10/2019    on chest    FAMILY HISTORY Family History  Problem Relation Age of Onset  . Diabetes Mother   . Cancer Father        lung  . Stroke Sister   . Hyperlipidemia Sister   . Diabetes Sister   . Heart disease Brother   . Hyperlipidemia Brother   . Breast cancer Cousin   . Diabetes Cousin   . Diabetes Maternal Grandmother   . Amblyopia Neg Hx   . Blindness Neg Hx   . Cataracts Neg Hx   . Glaucoma Neg Hx   . Macular degeneration Neg Hx   . Retinal detachment Neg Hx   . Strabismus Neg Hx   . Retinitis pigmentosa Neg Hx     SOCIAL HISTORY Social History   Tobacco Use  . Smoking status: Never Smoker  . Smokeless tobacco: Never Used  Vaping Use  . Vaping Use: Never used  Substance Use Topics  . Alcohol use: Yes    Alcohol/week: 2.0 standard drinks    Types: 2 Glasses of wine per week  . Drug use: No         OPHTHALMIC EXAM:  Base Eye Exam    Visual Acuity (Snellen - Linear)      Right Left   Dist cc 20/20 20/30 +1   Dist ph cc  20/25 +1       Tonometry (Tonopen, 10:19 AM)      Right Left   Pressure 16 18       Pupils      Dark Light Shape React APD   Right 4 3 Round Brisk None   Left 4 3 Round Brisk None       Visual Fields (Counting fingers)      Left Right    Full Full       Extraocular Movement      Right Left    Full Full       Neuro/Psych    Oriented x3: Yes   Mood/Affect: Normal       Dilation    Both eyes: 1.0% Mydriacyl, 2.5% Phenylephrine @ 10:20 AM        Slit Lamp and Fundus Exam    Slit Lamp Exam      Right Left   Lids/Lashes Dermatochalasis - upper lid Dermatochalasis - upper lid   Conjunctiva/Sclera White and quiet White and quiet   Cornea 1+inferior Punctate epithelial erosions Inferior 1+ Punctate epithelial erosions   Anterior Chamber Deep and quiet Deep and quiet   Iris Round and dilated Round and dilated   Lens 1-2+ Nuclear sclerosis, 1-2+ Cortical cataract 1-2+ Nuclear sclerosis, 1-2+ Cortical cataract   Vitreous  Vitreous syneresis, Posterior vitreous detachment Vitreous syneresis, Posterior vitreous detachment       Fundus Exam  Right Left   Disc Pink and Sharp, Compact, focal PPP Pink and Sharp   C/D Ratio 0.3 0.4   Macula Flat, Blunted foveal reflex, mild Retinal pigment epithelial mottling, No heme or edema Flat, good foveal reflex, mild Retinal pigment epithelial mottling, No heme or edema   Vessels attenuated Normal   Periphery Attached, very mild scattered reticular changes Attached, small round area of chorioretinal atrophy at 0500 mid-zone (about 0.75DD) -- pavingstone degeneration, mild scattered reticular changes        Refraction    Wearing Rx      Sphere Cylinder Axis Add   Right -1.75 +1.50 025 +2.00   Left -2.00 +1.75 015 +2.00       Manifest Refraction      Sphere Cylinder Axis Dist VA   Right       Left -2.00 +2.00 010 20/20          IMAGING AND PROCEDURES  Imaging and Procedures for 09/07/17  OCT, Retina - OU - Both Eyes       Right Eye Quality was good. Central Foveal Thickness: 305. Progression has been stable. Findings include normal foveal contour, no IRF, no SRF.   Left Eye Quality was good. Central Foveal Thickness: 296. Progression has been stable. Findings include normal foveal contour, no IRF, no SRF.   Notes *Images captured and stored on drive  Diagnosis / Impression:  NFP; no IRF/SRF OU  Clinical management:  See below  Abbreviations: NFP - Normal foveal profile. CME - cystoid macular edema. PED - pigment epithelial detachment. IRF - intraretinal fluid. SRF - subretinal fluid. EZ - ellipsoid zone. ERM - epiretinal membrane. ORA - outer retinal atrophy. ORT - outer retinal tubulation. SRHM - subretinal hyper-reflective material                 ASSESSMENT/PLAN:    ICD-10-CM   1. 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 Retinal edema  H35.81 OCT, Retina -  OU - Both Eyes  4. Posterior vitreous detachment of both eyes  H43.813   5. Combined form of age-related cataract, both eyes  H25.813   6. Dry eyes  H04.123     1,2. Chorioretinal atrophy OS  Round focal area of chorioretinal atrophy / paving stone degeneration, 5 oclock midzone -- stable / no change (0.75DD)  No retinal tear, subretinal fluid or RD  Monitor  F/u 1-2 years, sooner prn  3. No retinal edema on exam or OCT  4. PVD / vitreous syneresis OU-   Discussed findings and prognosis  No RT or RD on 360 peripheral exam  Reviewed s/s of RT/RD  Strict return precautions for any such RT/RD signs/symptoms  5. Combined form of age-related cataract OU-   - The symptoms of cataract, surgical options, and treatments and risks were discussed with patient.  - discussed diagnosis and progression  - not yet visually significant  - monitor for now  6. DES OU-   - signfiicant PEE OU  - recommend artificial tears and lubricating ointment as needed   Ophthalmic Meds Ordered this visit:  No orders of the defined types were placed in this encounter.     Return for f/u 1-2 years, CR atrophy OS, DFE, OCT.   This document serves as a record of services personally performed by Gardiner Sleeper, MD, PhD. It was created on their behalf by Leeann Must, COA, an  ophthalmic technician. The creation of this record is the provider's dictation and/or activities during the visit.    Electronically signed by: Leeann Must, COA 12.16.2021 12:49 PM   This document serves as a record of services personally performed by Gardiner Sleeper, MD, PhD. It was created on their behalf by San Jetty. Owens Shark, OA an ophthalmic technician. The creation of this record is the provider's dictation and/or activities during the visit.    Electronically signed by: San Jetty. Marguerita Merles 12.20.2021 12:49 PM  Gardiner Sleeper, M.D., Ph.D. Diseases & Surgery of the Retina and [place] 08/11/2020   I have reviewed the above documentation for accuracy and completeness, and I agree with the above. Gardiner Sleeper, M.D., Ph.D. 08/11/20 12:50 PM

## 2020-08-11 ENCOUNTER — Encounter (INDEPENDENT_AMBULATORY_CARE_PROVIDER_SITE_OTHER): Payer: Self-pay | Admitting: Ophthalmology

## 2020-08-11 ENCOUNTER — Ambulatory Visit (INDEPENDENT_AMBULATORY_CARE_PROVIDER_SITE_OTHER): Payer: BC Managed Care – PPO | Admitting: Ophthalmology

## 2020-08-11 ENCOUNTER — Other Ambulatory Visit: Payer: Self-pay

## 2020-08-11 DIAGNOSIS — H3581 Retinal edema: Secondary | ICD-10-CM | POA: Diagnosis not present

## 2020-08-11 DIAGNOSIS — H35432 Paving stone degeneration of retina, left eye: Secondary | ICD-10-CM

## 2020-08-11 DIAGNOSIS — H31102 Choroidal degeneration, unspecified, left eye: Secondary | ICD-10-CM

## 2020-08-11 DIAGNOSIS — H43813 Vitreous degeneration, bilateral: Secondary | ICD-10-CM | POA: Diagnosis not present

## 2020-08-11 DIAGNOSIS — H04123 Dry eye syndrome of bilateral lacrimal glands: Secondary | ICD-10-CM

## 2020-08-11 DIAGNOSIS — H25813 Combined forms of age-related cataract, bilateral: Secondary | ICD-10-CM

## 2020-10-28 ENCOUNTER — Other Ambulatory Visit: Payer: Self-pay | Admitting: Family Medicine

## 2020-10-28 DIAGNOSIS — Z1231 Encounter for screening mammogram for malignant neoplasm of breast: Secondary | ICD-10-CM

## 2020-10-31 ENCOUNTER — Ambulatory Visit
Admission: RE | Admit: 2020-10-31 | Discharge: 2020-10-31 | Disposition: A | Discharge status: Home / Self Care | Payer: BC Managed Care – PPO | Source: Ambulatory Visit | Attending: Family Medicine | Admitting: Family Medicine

## 2020-10-31 ENCOUNTER — Other Ambulatory Visit: Payer: Self-pay

## 2020-10-31 DIAGNOSIS — Z1231 Encounter for screening mammogram for malignant neoplasm of breast: Secondary | ICD-10-CM

## 2020-11-10 ENCOUNTER — Encounter: Payer: Self-pay | Admitting: Family Medicine

## 2020-11-10 ENCOUNTER — Other Ambulatory Visit: Payer: Self-pay

## 2020-11-10 ENCOUNTER — Ambulatory Visit: Payer: BC Managed Care – PPO | Admitting: Family Medicine

## 2020-11-10 VITALS — BP 128/72 | HR 78 | Temp 98.7°F | Resp 14 | Ht 69.0 in | Wt 185.0 lb

## 2020-11-10 DIAGNOSIS — Z124 Encounter for screening for malignant neoplasm of cervix: Secondary | ICD-10-CM

## 2020-11-10 DIAGNOSIS — Z0000 Encounter for general adult medical examination without abnormal findings: Secondary | ICD-10-CM

## 2020-11-10 DIAGNOSIS — Z0001 Encounter for general adult medical examination with abnormal findings: Secondary | ICD-10-CM | POA: Diagnosis not present

## 2020-11-10 DIAGNOSIS — R739 Hyperglycemia, unspecified: Secondary | ICD-10-CM | POA: Diagnosis not present

## 2020-11-10 MED ORDER — METHOCARBAMOL 500 MG PO TABS: 500.0000 mg | ORAL_TABLET | Freq: Four times a day (QID) | ORAL | 0 refills | Status: DC | PRN

## 2020-11-10 MED ORDER — CLONAZEPAM 0.5 MG PO TABS: 0.5000 mg | ORAL_TABLET | Freq: Every evening | ORAL | 0 refills | Status: DC | PRN

## 2020-11-10 MED ORDER — EZETIMIBE 10 MG PO TABS: ORAL_TABLET | ORAL | 2 refills | Status: DC

## 2020-11-10 NOTE — Progress Notes (Signed)
Subjective:    Patient ID: Cheyenne Hanson, female    DOB: 11/12/1955, 65 y.o.   MRN: [id number]  HPI  Patient is a very pleasant 65 year old white female here today for CPE. Past medical history significant for obstructive sleep apnea on CPAP since 2010. At that time showed a sleep study that showed an apnea hypotony index of approximately 16. She is on AutoPap. She also has a history of an ACDF in the cervical spine performed by Dr. Ronnald Ramp in approximately 2010.  She states that she had a colonoscopy less than 5 years ago.  I do not have record of this however she states that this is up-to-date.  However she believes her last Pap smear was more than 3 years ago and therefore this is due.  She just had a mammogram in March that was normal.  She is not due for a bone density test until age 38.  She has a history of melanoma and skin cancer but sees a dermatologist every 3 months for full body skin check.  I reviewed her immunizations.  She is already had her flu shot this year, all 3 doses of the COVID vaccine, and Pneumovax 23.  She is due for a booster on Pneumovax 23 in 1 year.  She will be due for Prevnar when she turns 66.  She is already had Shingrix. Past Medical History:  Diagnosis Date  . Allergy    [employer]reesia 11/09/2020  . Anemia   . Anxiety    uses klonopin sparingly (30 lasts a year)  . Basal cell carcinoma    "face, back, chest" (07/21/2017)  . CAP (community acquired pneumonia) 07/20/2017  . GERD (gastroesophageal reflux disease)   . Headache    "maybe weekly; more w/seasonal allergies" (07/21/2017)  . Heart murmur    [employer]reesia 11/09/2020  . Hyperlipidemia   . Mitral valve prolapse   . OSA on CPAP    cpap since 2008  . Pneumonia 1990s X 1  . Seasonal allergies    Past Surgical History:  Procedure Laterality Date  . ANTERIOR CERVICAL DECOMP/DISCECTOMY FUSION  ?2010    Dr. Ronnald Ramp  . ARTHROSCOPIC REPAIR ACL Left 1997  . BACK SURGERY    . BASAL CELL CARCINOMA EXCISION      "face, back, chest" (07/21/2017)  . MELANOMA EXCISION  10/2019   on chest   Current Outpatient Medications on File Prior to Visit  Medication Sig Dispense Refill  . azelastine (ASTELIN) 0.1 % nasal spray PLACE 2 SPRAYS INTO BOTH NOSTRILS 2 (TWO) TIMES DAILY. 30 mL 3  . cetirizine (ZYRTEC) 10 MG tablet Take 10 mg by mouth daily.     No current facility-administered medications on file prior to visit.   Allergies  Allergen Reactions  . Latex Other (See Comments)    Blisters if she wears the gloves  . Statins     Muscle aches   Social History   Socioeconomic History  . Marital status: Married    Spouse name: Not on file  . Number of children: Not on file  . Years of education: Not on file  . Highest education level: Not on file  Occupational History  . Not on file  Tobacco Use  . Smoking status: Never Smoker  . Smokeless tobacco: Never Used  Vaping Use  . Vaping Use: Never used  Substance and Sexual Activity  . Alcohol use: Yes    Alcohol/week: 2.0 standard drinks    Types: 2 Glasses of wine  per week  . Drug use: No  . Sexual activity: Not Currently  Other Topics Concern  . Not on file  Social History Narrative  . Not on file   Social Determinants of Health   Financial Resource Strain: Not on file  Food Insecurity: Not on file  Transportation Needs: Not on file  Physical Activity: Not on file  Stress: Not on file  Social Connections: Not on file  Intimate Partner Violence: Not on file   Family History  Problem Relation Age of Onset  . Diabetes Mother   . Cancer Father        lung  . Stroke Sister   . Hyperlipidemia Sister   . Diabetes Sister   . Heart disease Brother   . Hyperlipidemia Brother   . Breast cancer Cousin   . Diabetes Cousin   . Diabetes Maternal Grandmother   . Amblyopia Neg Hx   . Blindness Neg Hx   . Cataracts Neg Hx   . Glaucoma Neg Hx   . Macular degeneration Neg Hx   . Retinal detachment Neg Hx   . Strabismus Neg Hx   .  Retinitis pigmentosa Neg Hx    Dad also committed suicide  Review of Systems  All other systems reviewed and are negative.      Objective:   Physical Exam Vitals reviewed. Exam conducted with a chaperone present.  Constitutional:      General: She is not in acute distress.    Appearance: She is well-developed. She is not diaphoretic.  HENT:     Head: Normocephalic and atraumatic.     Right Ear: External ear normal.     Left Ear: External ear normal.     Nose: Nose normal.     Mouth/Throat:     Pharynx: No oropharyngeal exudate.  Eyes:     General: No scleral icterus.       Right eye: No discharge.        Left eye: No discharge.     Conjunctiva/sclera: Conjunctivae normal.     Pupils: Pupils are equal, round, and reactive to light.  Neck:     Thyroid: No thyromegaly.     Vascular: No JVD.     Trachea: No tracheal deviation.  Cardiovascular:     Rate and Rhythm: Normal rate and regular rhythm.     Heart sounds: Normal heart sounds. No murmur heard. No friction rub. No gallop.   Pulmonary:     Effort: Pulmonary effort is normal. No respiratory distress.     Breath sounds: Normal breath sounds. No stridor. No wheezing or rales.  Chest:     Chest wall: No tenderness.  Abdominal:     General: Bowel sounds are normal. There is no distension.     Palpations: Abdomen is soft. There is no mass.     Tenderness: There is no abdominal tenderness. There is no guarding or rebound.  Genitourinary:    Exam position: Lithotomy position.     Labia:        Right: No rash or lesion.        Left: No rash or lesion.      Vagina: Normal.     Cervix: No cervical motion tenderness, friability, lesion or erythema.     Uterus: Normal.   Musculoskeletal:        General: No tenderness or deformity. Normal range of motion.     Cervical back: Normal range of motion and neck supple.  Lymphadenopathy:  Cervical: No cervical adenopathy.  Skin:    General: Skin is warm.     Coloration:  Skin is not pale.     Findings: No erythema or rash.  Neurological:     Mental Status: She is alert and oriented to person, place, and time.     Cranial Nerves: No cranial nerve deficit.     Motor: No abnormal muscle tone.     Coordination: Coordination normal.     Deep Tendon Reflexes: Reflexes are normal and symmetric.  Psychiatric:        Behavior: Behavior normal.        Thought Content: Thought content normal.        Judgment: Judgment normal.           Assessment & Plan:  General medical exam - Plan: CBC with Differential/Platelet, COMPLETE METABOLIC PANEL WITH GFR, Lipid panel, Hemoglobin A1c  Elevated blood sugar - Plan: Lipid panel, Hemoglobin A1c  Cervical cancer screening - Plan: PAP, Thin Prep w/HPV rflx HPV Type 16/18  Patient's exam today is completely normal.  I will check a CBC, CMP, lipid panel.  She states that she has been checking her sugars occasionally and finding elevated sugar readings.  She also has a family history of type 2 diabetes.  Therefore due to her history of elevated blood sugars and her family history I will check an A1c.  Her Pap smear was sent to pathology in a labeled container.  Mammogram is just been performed and is up-to-date.  Colonoscopy is not yet due nor is her bone density test.  She will be due for Pneumovax 23 next year and Prevnar at age 6.  Recommended 1200 mg a day of calcium and 1000 units a day of vitamin D.

## 2020-11-11 LAB — COMPLETE METABOLIC PANEL WITH GFR
AG Ratio: 1.7 (calc) (ref 1.0–2.5)
ALT: 26 U/L (ref 6–29)
AST: 20 U/L (ref 10–35)
Albumin: 4.3 g/dL (ref 3.6–5.1)
Alkaline phosphatase (APISO): 47 U/L (ref 37–153)
BUN: 14 mg/dL (ref 7–25)
CO2: 25 mmol/L (ref 20–32)
Calcium: 9.6 mg/dL (ref 8.6–10.4)
Chloride: 106 mmol/L (ref 98–110)
Creat: 0.82 mg/dL (ref 0.50–0.99)
GFR, Est African American: 88 mL/min/{1.73_m2} (ref >=60)
GFR, Est Non African American: 76 mL/min/{1.73_m2} (ref >=60)
Globulin: 2.5 g/dL (calc) (ref 1.9–3.7)
Glucose, Bld: 98 mg/dL (ref 65–99)
Potassium: 4.2 mmol/L (ref 3.5–5.3)
Sodium: 140 mmol/L (ref 135–146)
Total Bilirubin: 0.6 mg/dL (ref 0.2–1.2)
Total Protein: 6.8 g/dL (ref 6.1–8.1)

## 2020-11-11 LAB — CBC WITH DIFFERENTIAL/PLATELET
Absolute Monocytes: 462 cells/uL (ref 200–950)
Basophils Absolute: 40 cells/uL (ref 0–200)
Basophils Relative: 0.7 %
Eosinophils Absolute: 222 cells/uL (ref 15–500)
Eosinophils Relative: 3.9 %
HCT: 41.3 % (ref 35.0–45.0)
Hemoglobin: 13.4 g/dL (ref 11.7–15.5)
Lymphs Abs: 1545 cells/uL (ref 850–3900)
MCH: 30.3 pg (ref 27.0–33.0)
MCHC: 32.4 g/dL (ref 32.0–36.0)
MCV: 93.4 fL (ref 80.0–100.0)
MPV: 9.9 fL (ref 7.5–12.5)
Monocytes Relative: 8.1 %
Neutro Abs: 3431 cells/uL (ref 1500–7800)
Neutrophils Relative %: 60.2 %
Platelets: 229 10*3/uL (ref 140–400)
RBC: 4.42 10*6/uL (ref 3.80–5.10)
RDW: 12.9 % (ref 11.0–15.0)
Total Lymphocyte: 27.1 %
WBC: 5.7 10*3/uL (ref 3.8–10.8)

## 2020-11-11 LAB — HEMOGLOBIN A1C
Hgb A1c MFr Bld: 6 % of total Hgb — ABNORMAL HIGH (ref <5.7)
Mean Plasma Glucose: 126 mg/dL
eAG (mmol/L): 7 mmol/L

## 2020-11-11 LAB — LIPID PANEL
Cholesterol: 210 mg/dL — ABNORMAL HIGH (ref <200)
HDL: 42 mg/dL — ABNORMAL LOW (ref >=50)
LDL Cholesterol (Calc): 124 mg/dL (calc) — ABNORMAL HIGH
Non-HDL Cholesterol (Calc): 168 mg/dL (calc) — ABNORMAL HIGH (ref <130)
Total CHOL/HDL Ratio: 5 (calc) — ABNORMAL HIGH (ref <5.0)
Triglycerides: 285 mg/dL — ABNORMAL HIGH (ref <150)

## 2020-11-12 LAB — PAP, TP IMAGING W/ HPV RNA, RFLX HPV TYPE 16,18/45: HPV DNA High Risk: NOT DETECTED

## 2021-08-14 MED ORDER — EZETIMIBE 10 MG PO TABS: 10.0000 mg | ORAL_TABLET | Freq: Every day | ORAL | 1 refills | Status: DC

## 2021-08-14 NOTE — Addendum Note (Signed)
Addended by: Jaynie Crumble on: 08/14/2021 01:59 PM   Modules accepted: Orders

## 2021-10-15 ENCOUNTER — Other Ambulatory Visit: Payer: Self-pay | Admitting: Family Medicine

## 2021-10-15 DIAGNOSIS — Z1231 Encounter for screening mammogram for malignant neoplasm of breast: Secondary | ICD-10-CM

## 2021-11-02 ENCOUNTER — Ambulatory Visit
Admission: RE | Admit: 2021-11-02 | Discharge: 2021-11-02 | Disposition: A | Discharge status: Home / Self Care | Payer: BC Managed Care – PPO | Source: Ambulatory Visit | Attending: Family Medicine | Admitting: Family Medicine

## 2021-11-02 DIAGNOSIS — Z1231 Encounter for screening mammogram for malignant neoplasm of breast: Secondary | ICD-10-CM

## 2021-11-23 ENCOUNTER — Encounter: Payer: BC Managed Care – PPO | Admitting: Family Medicine

## 2021-11-24 ENCOUNTER — Ambulatory Visit (INDEPENDENT_AMBULATORY_CARE_PROVIDER_SITE_OTHER): Payer: BC Managed Care – PPO | Admitting: Family Medicine

## 2021-11-24 VITALS — BP 100/80 | HR 68 | Temp 96.2°F | Ht 69.0 in | Wt 164.8 lb

## 2021-11-24 DIAGNOSIS — Z780 Asymptomatic menopausal state: Secondary | ICD-10-CM

## 2021-11-24 DIAGNOSIS — Z0000 Encounter for general adult medical examination without abnormal findings: Secondary | ICD-10-CM | POA: Diagnosis not present

## 2021-11-24 DIAGNOSIS — E7800 Pure hypercholesterolemia, unspecified: Secondary | ICD-10-CM | POA: Diagnosis not present

## 2021-11-24 MED ORDER — AZELASTINE HCL 0.1 % NA SOLN: NASAL | 11 refills | Status: DC

## 2021-11-24 MED ORDER — EZETIMIBE 10 MG PO TABS: 10.0000 mg | ORAL_TABLET | Freq: Every day | ORAL | 3 refills | Status: DC

## 2021-11-24 NOTE — Addendum Note (Signed)
Addended by: Jenna Luo T on: 11/24/2021 10:13 AM ? ? Modules accepted: Orders ? ?

## 2021-11-24 NOTE — Progress Notes (Signed)
? ?Subjective:  ? ? Patient ID: Cheyenne Hanson, female    DOB: [birth year], 66 y.o.   MRN: [id number] ? ?HPI ?Patient is a very pleasant 66 year old Caucasian female here today for a complete physical exam.  Past medical history is significant for melanoma on her chest that was removed by her dermatologist.  She follows up with them regularly.  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  She has some resultant right arm numbness and tingling both in the distribution of the median nerve and ulnar nerve but this does not bother her and she prefers not to pursue additional work-up at this time. ?Her last colonoscopy was around 2017 and is up-to-date.  She just had her mammogram and this was normal.  Her Pap smear was last year and was normal.  She is due for a bone density test.  She is not due for Prevnar 20 until after 65.  She had the shingles vaccine.  She had also the most recent COVID vaccination. ?Past Medical History:  ?Diagnosis Date  ? Allergy   ? Phreesia 11/09/2020  ? Anemia   ? Anxiety   ? uses klonopin sparingly (30 lasts a year)  ? Basal cell carcinoma   ? "face, back, chest" (07/21/2017)  ? CAP (community acquired pneumonia) 07/20/2017  ? GERD (gastroesophageal reflux disease)   ? Headache   ? "maybe weekly; more w/seasonal allergies" (07/21/2017)  ? Heart murmur   ? Phreesia 11/09/2020  ? Hyperlipidemia   ? Mitral valve prolapse   ? OSA on CPAP   ? cpap since 2008  ? Pneumonia 1990s X 1  ? Seasonal allergies   ? ?Past Surgical History:  ?Procedure Laterality Date  ? ANTERIOR CERVICAL DECOMP/DISCECTOMY FUSION  ?2010  ?  Dr. Ronnald Ramp  ? ARTHROSCOPIC REPAIR ACL Left 1997  ? BACK SURGERY    ? BASAL CELL CARCINOMA EXCISION    ? "face, back, chest" (07/21/2017)  ? MELANOMA EXCISION  10/2019  ? on chest  ? ?Current Outpatient Medications on File Prior to Visit  ?Medication Sig Dispense Refill  ? azelastine (ASTELIN) 0.1 % nasal spray PLACE 2 SPRAYS INTO BOTH  NOSTRILS 2 (TWO) TIMES DAILY. 30 mL 3  ? cetirizine (ZYRTEC) 10 MG tablet Take 10 mg by mouth daily.    ? clonazePAM (KLONOPIN) 0.5 MG tablet Take 1 tablet (0.5 mg total) by mouth at bedtime as needed for anxiety. 30 tablet 0  ? ezetimibe (ZETIA) 10 MG tablet Take 1 tablet (10 mg total) by mouth daily. 90 tablet 1  ? methocarbamol (ROBAXIN) 500 MG tablet Take 1 tablet (500 mg total) by mouth every 6 (six) hours as needed for muscle spasms. 30 tablet 0  ? ?No current facility-administered medications on file prior to visit.  ? ?Allergies  ?Allergen Reactions  ? Latex Other (See Comments)  ?  Blisters if she wears the gloves  ? Statins   ?  Muscle aches  ? ?Social History  ? ?Socioeconomic History  ? Marital status: Married  ?  Spouse name: Not on file  ? Number of children: Not on file  ? Years of education: Not on file  ? Highest education level: Not on file  ?Occupational History  ? Not on file  ?Tobacco Use  ? Smoking status: Never  ? Smokeless tobacco: Never  ?Vaping Use  ? Vaping Use: Never used  ?Substance and Sexual Activity  ? Alcohol  use: Yes  ?  Alcohol/week: 2.0 standard drinks  ?  Types: 2 Glasses of wine per week  ? Drug use: No  ? Sexual activity: Not Currently  ?Other Topics Concern  ? Not on file  ?Social History Narrative  ? Not on file  ? ?Social Determinants of Health  ? ?Financial Resource Strain: Not on file  ?Food Insecurity: Not on file  ?Transportation Needs: Not on file  ?Physical Activity: Not on file  ?Stress: Not on file  ?Social Connections: Not on file  ?Intimate Partner Violence: Not on file  ? ?Family History  ?Problem Relation Age of Onset  ? Diabetes Mother   ? Cancer Father   ?     lung  ? Stroke Sister   ? Hyperlipidemia Sister   ? Diabetes Sister   ? Heart disease Brother   ? Hyperlipidemia Brother   ? Breast cancer Cousin   ? Diabetes Cousin   ? Diabetes Maternal Grandmother   ? Amblyopia Neg Hx   ? Blindness Neg Hx   ? Cataracts Neg Hx   ? Glaucoma Neg Hx   ? Macular  degeneration Neg Hx   ? Retinal detachment Neg Hx   ? Strabismus Neg Hx   ? Retinitis pigmentosa Neg Hx   ? ?Dad also committed suicide ? ?Review of Systems  ?All other systems reviewed and are negative. ? ?   ?Objective:  ? Physical Exam ?Vitals reviewed. Exam conducted with a chaperone present.  ?Constitutional:   ?   General: She is not in acute distress. ?   Appearance: She is well-developed. She is not diaphoretic.  ?HENT:  ?   Head: Normocephalic and atraumatic.  ?   Right Ear: External ear normal.  ?   Left Ear: External ear normal.  ?   Nose: Nose normal.  ?   Mouth/Throat:  ?   Pharynx: No oropharyngeal exudate.  ?Eyes:  ?   General: No scleral icterus.    ?   Right eye: No discharge.     ?   Left eye: No discharge.  ?   Conjunctiva/sclera: Conjunctivae normal.  ?   Pupils: Pupils are equal, round, and reactive to light.  ?Neck:  ?   Thyroid: No thyromegaly.  ?   Vascular: No JVD.  ?   Trachea: No tracheal deviation.  ?Cardiovascular:  ?   Rate and Rhythm: Normal rate and regular rhythm.  ?   Heart sounds: Normal heart sounds. No murmur heard. ?  No friction rub. No gallop.  ?Pulmonary:  ?   Effort: Pulmonary effort is normal. No respiratory distress.  ?   Breath sounds: Normal breath sounds. No stridor. No wheezing or rales.  ?Chest:  ?   Chest wall: No tenderness.  ?Abdominal:  ?   General: Bowel sounds are normal. There is no distension.  ?   Palpations: Abdomen is soft. There is no mass.  ?   Tenderness: There is no abdominal tenderness. There is no guarding or rebound.  ?Genitourinary: ?   Exam position: Lithotomy position.  ?   Labia:     ?   Right: No rash or lesion.     ?   Left: No rash or lesion.   ?   Vagina: Normal.  ?   Cervix: No cervical motion tenderness, friability, lesion or erythema.  ?   Uterus: Normal.   ?Musculoskeletal:     ?   General: No tenderness or deformity. Normal range of  motion.  ?   Cervical back: Normal range of motion and neck supple.  ?Lymphadenopathy:  ?   Cervical: No  cervical adenopathy.  ?Skin: ?   General: Skin is warm.  ?   Coloration: Skin is not pale.  ?   Findings: No erythema or rash.  ?Neurological:  ?   Mental Status: She is alert and oriented to person, place, and time.  ?   Cranial Nerves: No cranial nerve deficit.  ?   Motor: No abnormal muscle tone.  ?   Coordination: Coordination normal.  ?   Deep Tendon Reflexes: Reflexes are normal and symmetric.  ?Psychiatric:     ?   Behavior: Behavior normal.     ?   Thought Content: Thought content normal.     ?   Judgment: Judgment normal.  ? ? ? ? ? ?   ?Assessment & Plan:  ?Postmenopausal estrogen deficiency - Plan: DG Bone Density ? ?Pure hypercholesterolemia - Plan: CBC with Differential/Platelet, Lipid panel, COMPLETE METABOLIC PANEL WITH GFR ? ?General medical exam ?Physical exam today looks excellent.  The patient has lost weight and her BMI is down to 24.  This is outstanding.  Her blood pressure today is excellent at 100/80.  Her immunizations are up-to-date.  Her mammogram is up-to-date.  Her colonoscopy is up-to-date.  I do not feel that she needs another Pap smear.  I will schedule her for a bone density.  I recommended 1200 mg a day of calcium and 1000 units a day of vitamin D.  I will check a CBC CMP and a lipid panel. ?

## 2021-11-25 LAB — CBC WITH DIFFERENTIAL/PLATELET
Absolute Monocytes: 382 cells/uL (ref 200–950)
Basophils Absolute: 29 cells/uL (ref 0–200)
Basophils Relative: 0.4 %
Eosinophils Absolute: 108 cells/uL (ref 15–500)
Eosinophils Relative: 1.5 %
HCT: 42.3 % (ref 35.0–45.0)
Hemoglobin: 13.8 g/dL (ref 11.7–15.5)
Lymphs Abs: 1224 cells/uL (ref 850–3900)
MCH: 30.4 pg (ref 27.0–33.0)
MCHC: 32.6 g/dL (ref 32.0–36.0)
MCV: 93.2 fL (ref 80.0–100.0)
MPV: 10.7 fL (ref 7.5–12.5)
Monocytes Relative: 5.3 %
Neutro Abs: 5458 cells/uL (ref 1500–7800)
Neutrophils Relative %: 75.8 %
Platelets: 210 10*3/uL (ref 140–400)
RBC: 4.54 10*6/uL (ref 3.80–5.10)
RDW: 12.6 % (ref 11.0–15.0)
Total Lymphocyte: 17 %
WBC: 7.2 10*3/uL (ref 3.8–10.8)

## 2021-11-25 LAB — COMPLETE METABOLIC PANEL WITH GFR
AG Ratio: 2 (calc) (ref 1.0–2.5)
ALT: 24 U/L (ref 6–29)
AST: 22 U/L (ref 10–35)
Albumin: 4.4 g/dL (ref 3.6–5.1)
Alkaline phosphatase (APISO): 51 U/L (ref 37–153)
BUN: 11 mg/dL (ref 7–25)
CO2: 27 mmol/L (ref 20–32)
Calcium: 9.8 mg/dL (ref 8.6–10.4)
Chloride: 103 mmol/L (ref 98–110)
Creat: 0.79 mg/dL (ref 0.50–1.05)
Globulin: 2.2 g/dL (calc) (ref 1.9–3.7)
Glucose, Bld: 99 mg/dL (ref 65–99)
Potassium: 5 mmol/L (ref 3.5–5.3)
Sodium: 138 mmol/L (ref 135–146)
Total Bilirubin: 0.5 mg/dL (ref 0.2–1.2)
Total Protein: 6.6 g/dL (ref 6.1–8.1)
eGFR: 83 mL/min/{1.73_m2} (ref >=60)

## 2021-11-25 LAB — LIPID PANEL
Cholesterol: 166 mg/dL (ref <200)
HDL: 56 mg/dL (ref >=50)
LDL Cholesterol (Calc): 97 mg/dL (calc)
Non-HDL Cholesterol (Calc): 110 mg/dL (calc) (ref <130)
Total CHOL/HDL Ratio: 3 (calc) (ref <5.0)
Triglycerides: 51 mg/dL (ref <150)

## 2021-12-10 ENCOUNTER — Ambulatory Visit
Admission: RE | Admit: 2021-12-10 | Discharge: 2021-12-10 | Disposition: A | Discharge status: Home / Self Care | Payer: BC Managed Care – PPO | Source: Ambulatory Visit | Attending: Family Medicine | Admitting: Family Medicine

## 2021-12-10 DIAGNOSIS — Z780 Asymptomatic menopausal state: Secondary | ICD-10-CM

## 2021-12-22 DIAGNOSIS — D2261 Melanocytic nevi of right upper limb, including shoulder: Secondary | ICD-10-CM | POA: Diagnosis not present

## 2021-12-22 DIAGNOSIS — D225 Melanocytic nevi of trunk: Secondary | ICD-10-CM | POA: Diagnosis not present

## 2021-12-22 DIAGNOSIS — D2272 Melanocytic nevi of left lower limb, including hip: Secondary | ICD-10-CM | POA: Diagnosis not present

## 2021-12-22 DIAGNOSIS — Z85820 Personal history of malignant melanoma of skin: Secondary | ICD-10-CM | POA: Diagnosis not present

## 2021-12-22 DIAGNOSIS — L570 Actinic keratosis: Secondary | ICD-10-CM | POA: Diagnosis not present

## 2021-12-28 DIAGNOSIS — L98499 Non-pressure chronic ulcer of skin of other sites with unspecified severity: Secondary | ICD-10-CM | POA: Diagnosis not present

## 2021-12-28 DIAGNOSIS — D485 Neoplasm of uncertain behavior of skin: Secondary | ICD-10-CM | POA: Diagnosis not present

## 2022-01-12 ENCOUNTER — Ambulatory Visit (INDEPENDENT_AMBULATORY_CARE_PROVIDER_SITE_OTHER): Payer: Medicare Other | Admitting: Family Medicine

## 2022-01-12 ENCOUNTER — Encounter: Payer: Self-pay | Admitting: Family Medicine

## 2022-01-12 VITALS — BP 118/80 | HR 78 | Temp 98.3°F | Ht 69.0 in | Wt 154.4 lb

## 2022-01-12 DIAGNOSIS — J329 Chronic sinusitis, unspecified: Secondary | ICD-10-CM | POA: Diagnosis not present

## 2022-01-12 DIAGNOSIS — J310 Chronic rhinitis: Secondary | ICD-10-CM

## 2022-01-12 MED ORDER — AMOXICILLIN-POT CLAVULANATE 875-125 MG PO TABS: 1.0000 | ORAL_TABLET | Freq: Two times a day (BID) | ORAL | 0 refills | Status: DC

## 2022-01-12 MED ORDER — FLUCONAZOLE 150 MG PO TABS: 150.0000 mg | ORAL_TABLET | Freq: Once | ORAL | 0 refills | Status: AC

## 2022-01-12 NOTE — Progress Notes (Signed)
Subjective:    Patient ID: Cheyenne Hanson, female    DOB: 09-28-1955, 66 y.o.   MRN: [id number]  Cough Patient reports a 10-day history of head congestion, rhinorrhea, postnasal drip.  She now is blowing out bloody purulent mucus from both nostrils when she blows her nose.  She reports pain and pressure in her left maxillary sinus.  She reports headaches and subjective fevers. Past Medical History:  Diagnosis Date   Allergy    Phreesia 11/09/2020   Anemia    Anxiety    uses klonopin sparingly (30 lasts a year)   Basal cell carcinoma    "face, back, chest" (07/21/2017)   CAP (community acquired pneumonia) 07/20/2017   GERD (gastroesophageal reflux disease)    Headache    "maybe weekly; more w/seasonal allergies" (07/21/2017)   Heart murmur    Phreesia 11/09/2020   Hyperlipidemia    Mitral valve prolapse    OSA on CPAP    cpap since 2008   Pneumonia 1990s X 1   Seasonal allergies    Past Surgical History:  Procedure Laterality Date   ANTERIOR CERVICAL DECOMP/DISCECTOMY FUSION  ?2010    Dr. Ronnald Ramp   ARTHROSCOPIC REPAIR ACL Left 1997   BACK SURGERY     BASAL CELL CARCINOMA EXCISION     "face, back, chest" (07/21/2017)   MELANOMA EXCISION  10/2019   on chest   Current Outpatient Medications on File Prior to Visit  Medication Sig Dispense Refill   azelastine (ASTELIN) 0.1 % nasal spray PLACE 2 SPRAYS INTO BOTH NOSTRILS 2 (TWO) TIMES DAILY. 30 mL 11   cetirizine (ZYRTEC) 10 MG tablet Take 10 mg by mouth daily.     clonazePAM (KLONOPIN) 0.5 MG tablet Take 1 tablet (0.5 mg total) by mouth at bedtime as needed for anxiety. 30 tablet 0   ezetimibe (ZETIA) 10 MG tablet Take 1 tablet (10 mg total) by mouth daily. 90 tablet 3   methocarbamol (ROBAXIN) 500 MG tablet Take 1 tablet (500 mg total) by mouth every 6 (six) hours as needed for muscle spasms. (Patient not taking: Reported on 01/12/2022) 30 tablet 0   No current facility-administered medications on file prior to visit.    Allergies  Allergen Reactions   Latex Other (See Comments)    Blisters if she wears the gloves   Statins     Muscle aches   Social History   Socioeconomic History   Marital status: Married    Spouse name: Not on file   Number of children: Not on file   Years of education: Not on file   Highest education level: Not on file  Occupational History   Not on file  Tobacco Use   Smoking status: Never   Smokeless tobacco: Never  Vaping Use   Vaping Use: Never used  Substance and Sexual Activity   Alcohol use: Yes    Alcohol/week: 2.0 standard drinks    Types: 2 Glasses of wine per week   Drug use: No   Sexual activity: Not Currently  Other Topics Concern   Not on file  Social History Narrative   Not on file   Social Determinants of Health   Financial Resource Strain: Not on file  Food Insecurity: Not on file  Transportation Needs: Not on file  Physical Activity: Not on file  Stress: Not on file  Social Connections: Not on file  Intimate Partner Violence: Not on file   Family History  Problem Relation Age of Onset  Diabetes Mother    Cancer Father        lung   Stroke Sister    Hyperlipidemia Sister    Diabetes Sister    Heart disease Brother    Hyperlipidemia Brother    Breast cancer Cousin    Diabetes Cousin    Diabetes Maternal Grandmother    Amblyopia Neg Hx    Blindness Neg Hx    Cataracts Neg Hx    Glaucoma Neg Hx    Macular degeneration Neg Hx    Retinal detachment Neg Hx    Strabismus Neg Hx    Retinitis pigmentosa Neg Hx    Dad also committed suicide  Review of Systems  Respiratory:  Positive for cough.   All other systems reviewed and are negative.     Objective:   Physical Exam Vitals reviewed. Exam conducted with a chaperone present.  Constitutional:      General: She is not in acute distress.    Appearance: She is well-developed. She is not diaphoretic.  HENT:     Head: Normocephalic and atraumatic.     Right Ear: Tympanic  membrane, ear canal and external ear normal.     Left Ear: Tympanic membrane, ear canal and external ear normal.     Nose: Mucosal edema, congestion and rhinorrhea present.     Right Turbinates: Swollen.     Left Turbinates: Swollen.     Right Sinus: Maxillary sinus tenderness present.     Left Sinus: Maxillary sinus tenderness present.     Mouth/Throat:     Pharynx: No oropharyngeal exudate.  Eyes:     General: No scleral icterus.       Right eye: No discharge.        Left eye: No discharge.     Conjunctiva/sclera: Conjunctivae normal.     Pupils: Pupils are equal, round, and reactive to light.  Neck:     Thyroid: No thyromegaly.     Vascular: No JVD.     Trachea: No tracheal deviation.  Cardiovascular:     Rate and Rhythm: Normal rate and regular rhythm.     Heart sounds: Normal heart sounds. No murmur heard.   No friction rub. No gallop.  Pulmonary:     Effort: Pulmonary effort is normal. No respiratory distress.     Breath sounds: Normal breath sounds. No stridor. No wheezing or rales.  Chest:     Chest wall: No tenderness.  Abdominal:     General: Bowel sounds are normal. There is no distension.     Palpations: Abdomen is soft. There is no mass.     Tenderness: There is no abdominal tenderness. There is no guarding or rebound.  Genitourinary:    Exam position: Lithotomy position.     Labia:        Right: No rash or lesion.        Left: No rash or lesion.      Vagina: Normal.     Cervix: No cervical motion tenderness, friability, lesion or erythema.     Uterus: Normal.   Musculoskeletal:        General: No tenderness or deformity. Normal range of motion.     Cervical back: Normal range of motion and neck supple.  Lymphadenopathy:     Cervical: No cervical adenopathy.  Skin:    General: Skin is warm.     Coloration: Skin is not pale.     Findings: No erythema or rash.  Neurological:  Mental Status: She is alert and oriented to person, place, and time.      Cranial Nerves: No cranial nerve deficit.     Motor: No abnormal muscle tone.     Coordination: Coordination normal.     Deep Tendon Reflexes: Reflexes are normal and symmetric.  Psychiatric:        Behavior: Behavior normal.        Thought Content: Thought content normal.        Judgment: Judgment normal.          Assessment & Plan:  Rhinosinusitis Patient has a sinus infection.  Begin Augmentin 875 mg twice daily for 10 days and then recheck if no better in 1 week.

## 2022-04-19 DIAGNOSIS — J00 Acute nasopharyngitis [common cold]: Secondary | ICD-10-CM | POA: Diagnosis not present

## 2022-04-19 DIAGNOSIS — R509 Fever, unspecified: Secondary | ICD-10-CM | POA: Diagnosis not present

## 2022-06-30 ENCOUNTER — Telehealth: Payer: Self-pay | Admitting: Family Medicine

## 2022-06-30 NOTE — Telephone Encounter (Signed)
Patient called to report positive COVID test taken this morning; stated she thought it was a sinus infection.  Patient requesting for provider to call in medication to pharmacy.   Pharmacy confirmed as   [facility] [id number] [place], [place] - [place] [address] [place] [address] Phone: [phone]  Fax: [phone]    Please advise patient at [phone].

## 2022-07-01 ENCOUNTER — Ambulatory Visit (INDEPENDENT_AMBULATORY_CARE_PROVIDER_SITE_OTHER): Payer: Medicare Other | Admitting: Family Medicine

## 2022-07-01 VITALS — BP 112/78 | HR 97 | Temp 98.7°F | Ht 69.0 in | Wt 144.0 lb

## 2022-07-01 DIAGNOSIS — U071 COVID-19: Secondary | ICD-10-CM | POA: Diagnosis not present

## 2022-07-01 MED ORDER — CLONAZEPAM 0.5 MG PO TABS: 0.5000 mg | ORAL_TABLET | Freq: Every evening | ORAL | 0 refills | Status: AC | PRN

## 2022-07-01 MED ORDER — NIRMATRELVIR/RITONAVIR (PAXLOVID)TABLET: 3.0000 | ORAL_TABLET | Freq: Two times a day (BID) | ORAL | 0 refills | Status: AC

## 2022-07-01 NOTE — Progress Notes (Signed)
Subjective:    Patient ID: Cheyenne Hanson, female    DOB: 20-Feb-1956, 66 y.o.   MRN: [id number]  Patient states that she started feeling she was coming down with a sinus infection yesterday.  She took a COVID test and tested positive.  She reports head congestion rhinorrhea and cough.  Today she is feeling better.  She denies any chest pain shortness of breath.  Her husband has a history of immunosuppression and she is concerned about him catching COVID from her Past Medical History:  Diagnosis Date   Allergy    Phreesia 11/09/2020   Anemia    Anxiety    uses klonopin sparingly (30 lasts a year)   Basal cell carcinoma    "face, back, chest" (07/21/2017)   CAP (community acquired pneumonia) 07/20/2017   GERD (gastroesophageal reflux disease)    Headache    "maybe weekly; more w/seasonal allergies" (07/21/2017)   Heart murmur    Phreesia 11/09/2020   Hyperlipidemia    Mitral valve prolapse    OSA on CPAP    cpap since 2008   Pneumonia 1990s X 1   Seasonal allergies    Past Surgical History:  Procedure Laterality Date   ANTERIOR CERVICAL DECOMP/DISCECTOMY FUSION  ?2010    Dr. Ronnald Ramp   ARTHROSCOPIC REPAIR ACL Left 1997   BACK SURGERY     BASAL CELL CARCINOMA EXCISION     "face, back, chest" (07/21/2017)   MELANOMA EXCISION  10/2019   on chest   Current Outpatient Medications on File Prior to Visit  Medication Sig Dispense Refill   azelastine (ASTELIN) 0.1 % nasal spray PLACE 2 SPRAYS INTO BOTH NOSTRILS 2 (TWO) TIMES DAILY. 30 mL 11   cetirizine (ZYRTEC) 10 MG tablet Take 10 mg by mouth daily.     clonazePAM (KLONOPIN) 0.5 MG tablet Take 1 tablet (0.5 mg total) by mouth at bedtime as needed for anxiety. 30 tablet 0   ezetimibe (ZETIA) 10 MG tablet Take 1 tablet (10 mg total) by mouth daily. 90 tablet 3   guaiFENesin (MUCINEX) 600 MG 12 hr tablet Take by mouth 2 (two) times daily.     methocarbamol (ROBAXIN) 500 MG tablet Take 1 tablet (500 mg total) by mouth every 6 (six)  hours as needed for muscle spasms. 30 tablet 0   No current facility-administered medications on file prior to visit.   Allergies  Allergen Reactions   Latex Other (See Comments)    Blisters if she wears the gloves   Statins     Muscle aches   Social History   Socioeconomic History   Marital status: Married    Spouse name: Not on file   Number of children: Not on file   Years of education: Not on file   Highest education level: Not on file  Occupational History   Not on file  Tobacco Use   Smoking status: Never   Smokeless tobacco: Never  Vaping Use   Vaping Use: Never used  Substance and Sexual Activity   Alcohol use: Yes    Alcohol/week: 2.0 standard drinks of alcohol    Types: 2 Glasses of wine per week   Drug use: No   Sexual activity: Not Currently  Other Topics Concern   Not on file  Social History Narrative   Not on file   Social Determinants of Health   Financial Resource Strain: Not on file  Food Insecurity: Not on file  Transportation Needs: Not on file  Physical Activity:  Not on file  Stress: Not on file  Social Connections: Not on file  Intimate Partner Violence: Not on file   Family History  Problem Relation Age of Onset   Diabetes Mother    Cancer Father        lung   Stroke Sister    Hyperlipidemia Sister    Diabetes Sister    Heart disease Brother    Hyperlipidemia Brother    Breast cancer Cousin    Diabetes Cousin    Diabetes Maternal Grandmother    Amblyopia Neg Hx    Blindness Neg Hx    Cataracts Neg Hx    Glaucoma Neg Hx    Macular degeneration Neg Hx    Retinal detachment Neg Hx    Strabismus Neg Hx    Retinitis pigmentosa Neg Hx    Dad also committed suicide  Review of Systems  Respiratory:  Positive for cough.   All other systems reviewed and are negative.      Objective:   Physical Exam Vitals reviewed. Exam conducted with a chaperone present.  Constitutional:      General: She is not in acute distress.     Appearance: She is well-developed. She is not diaphoretic.  HENT:     Head: Normocephalic and atraumatic.     Right Ear: Tympanic membrane, ear canal and external ear normal.     Left Ear: Tympanic membrane, ear canal and external ear normal.     Nose: Mucosal edema, congestion and rhinorrhea present.     Right Turbinates: Swollen.     Left Turbinates: Swollen.     Right Sinus: Maxillary sinus tenderness present.     Left Sinus: Maxillary sinus tenderness present.     Mouth/Throat:     Pharynx: No oropharyngeal exudate.  Eyes:     General: No scleral icterus.       Right eye: No discharge.        Left eye: No discharge.     Conjunctiva/sclera: Conjunctivae normal.     Pupils: Pupils are equal, round, and reactive to light.  Neck:     Thyroid: No thyromegaly.     Vascular: No JVD.     Trachea: No tracheal deviation.  Cardiovascular:     Rate and Rhythm: Normal rate and regular rhythm.     Heart sounds: Normal heart sounds. No murmur heard.    No friction rub. No gallop.  Pulmonary:     Effort: Pulmonary effort is normal. No respiratory distress.     Breath sounds: Normal breath sounds. No stridor. No wheezing or rales.  Chest:     Chest wall: No tenderness.  Abdominal:     General: Bowel sounds are normal. There is no distension.     Palpations: Abdomen is soft. There is no mass.     Tenderness: There is no abdominal tenderness. There is no guarding or rebound.  Genitourinary:    Exam position: Lithotomy position.     Labia:        Right: No rash or lesion.        Left: No rash or lesion.      Vagina: Normal.     Cervix: No cervical motion tenderness, friability, lesion or erythema.     Uterus: Normal.   Musculoskeletal:        General: No tenderness or deformity. Normal range of motion.     Cervical back: Normal range of motion and neck supple.  Lymphadenopathy:  Cervical: No cervical adenopathy.  Skin:    General: Skin is warm.     Coloration: Skin is not pale.      Findings: No erythema or rash.  Neurological:     Mental Status: She is alert and oriented to person, place, and time.     Cranial Nerves: No cranial nerve deficit.     Motor: No abnormal muscle tone.     Coordination: Coordination normal.     Deep Tendon Reflexes: Reflexes are normal and symmetric.  Psychiatric:        Behavior: Behavior normal.        Thought Content: Thought content normal.        Judgment: Judgment normal.           Assessment & Plan:  COVID Begin Paxlovid 3 tablets twice daily for 5 days.  Also recommended that her husband begin testing at home and if he test positive due to his chronic medical problems I recommended molnupiravir 4 tablets twice daily for 5 days

## 2022-08-07 ENCOUNTER — Emergency Department: Payer: Medicare Other

## 2022-08-07 ENCOUNTER — Emergency Department
Admission: EM | Admit: 2022-08-07 | Discharge: 2022-08-07 | Disposition: A | Discharge status: Home / Self Care | Payer: Medicare Other | Attending: Emergency Medicine | Admitting: Emergency Medicine

## 2022-08-07 ENCOUNTER — Other Ambulatory Visit: Payer: Self-pay

## 2022-08-07 ENCOUNTER — Ambulatory Visit
Admission: EM | Admit: 2022-08-07 | Discharge: 2022-08-07 | Disposition: A | Discharge status: Home / Self Care | Payer: BC Managed Care – PPO

## 2022-08-07 DIAGNOSIS — I700 Atherosclerosis of aorta: Secondary | ICD-10-CM | POA: Insufficient documentation

## 2022-08-07 DIAGNOSIS — J984 Other disorders of lung: Secondary | ICD-10-CM | POA: Diagnosis not present

## 2022-08-07 DIAGNOSIS — J069 Acute upper respiratory infection, unspecified: Secondary | ICD-10-CM | POA: Insufficient documentation

## 2022-08-07 DIAGNOSIS — Z1152 Encounter for screening for COVID-19: Secondary | ICD-10-CM | POA: Insufficient documentation

## 2022-08-07 DIAGNOSIS — R109 Unspecified abdominal pain: Secondary | ICD-10-CM | POA: Diagnosis not present

## 2022-08-07 DIAGNOSIS — J189 Pneumonia, unspecified organism: Secondary | ICD-10-CM | POA: Diagnosis not present

## 2022-08-07 DIAGNOSIS — B974 Respiratory syncytial virus as the cause of diseases classified elsewhere: Secondary | ICD-10-CM | POA: Insufficient documentation

## 2022-08-07 DIAGNOSIS — B338 Other specified viral diseases: Secondary | ICD-10-CM

## 2022-08-07 DIAGNOSIS — K7689 Other specified diseases of liver: Secondary | ICD-10-CM | POA: Diagnosis not present

## 2022-08-07 DIAGNOSIS — R059 Cough, unspecified: Secondary | ICD-10-CM | POA: Diagnosis not present

## 2022-08-07 LAB — CBC WITH DIFFERENTIAL/PLATELET
Abs Immature Granulocytes: 0.06 10*3/uL (ref 0.00–0.07)
Basophils Absolute: 0 10*3/uL (ref 0.0–0.1)
Basophils Relative: 0 %
Eosinophils Absolute: 0 10*3/uL (ref 0.0–0.5)
Eosinophils Relative: 0 %
HCT: 39.1 % (ref 36.0–46.0)
Hemoglobin: 12.8 g/dL (ref 12.0–15.0)
Immature Granulocytes: 0 %
Lymphocytes Relative: 4 %
Lymphs Abs: 0.7 10*3/uL (ref 0.7–4.0)
MCH: 30 pg (ref 26.0–34.0)
MCHC: 32.7 g/dL (ref 30.0–36.0)
MCV: 91.8 fL (ref 80.0–100.0)
Monocytes Absolute: 0.9 10*3/uL (ref 0.1–1.0)
Monocytes Relative: 6 %
Neutro Abs: 13.4 10*3/uL — ABNORMAL HIGH (ref 1.7–7.7)
Neutrophils Relative %: 90 %
Platelets: 177 10*3/uL (ref 150–400)
RBC: 4.26 MIL/uL (ref 3.87–5.11)
RDW: 13.4 % (ref 11.5–15.5)
WBC: 15.1 10*3/uL — ABNORMAL HIGH (ref 4.0–10.5)
nRBC: 0 % (ref 0.0–0.2)

## 2022-08-07 LAB — COMPREHENSIVE METABOLIC PANEL
ALT: 14 U/L (ref 0–44)
AST: 20 U/L (ref 15–41)
Albumin: 4 g/dL (ref 3.5–5.0)
Alkaline Phosphatase: 34 U/L — ABNORMAL LOW (ref 38–126)
Anion gap: 9 (ref 5–15)
BUN: 11 mg/dL (ref 8–23)
CO2: 24 mmol/L (ref 22–32)
Calcium: 9.3 mg/dL (ref 8.9–10.3)
Chloride: 101 mmol/L (ref 98–111)
Creatinine, Ser: 0.59 mg/dL (ref 0.44–1.00)
GFR, Estimated: >60 mL/min (ref >60)
Glucose, Bld: 98 mg/dL (ref 70–99)
Potassium: 3.9 mmol/L (ref 3.5–5.1)
Sodium: 134 mmol/L — ABNORMAL LOW (ref 135–145)
Total Bilirubin: 0.9 mg/dL (ref 0.3–1.2)
Total Protein: 6.9 g/dL (ref 6.5–8.1)

## 2022-08-07 LAB — URINALYSIS, ROUTINE W REFLEX MICROSCOPIC
Bilirubin Urine: NEGATIVE
Glucose, UA: NEGATIVE mg/dL
Hgb urine dipstick: NEGATIVE
Ketones, ur: NEGATIVE mg/dL
Leukocytes,Ua: NEGATIVE
Nitrite: NEGATIVE
Protein, ur: NEGATIVE mg/dL
Specific Gravity, Urine: 1.016 (ref 1.005–1.030)
pH: 5 (ref 5.0–8.0)

## 2022-08-07 LAB — RESP PANEL BY RT-PCR (RSV, FLU A&B, COVID)  RVPGX2
Influenza A by PCR: NEGATIVE
Influenza B by PCR: NEGATIVE
Resp Syncytial Virus by PCR: POSITIVE — AB
SARS Coronavirus 2 by RT PCR: NEGATIVE

## 2022-08-07 LAB — LIPASE, BLOOD: Lipase: 33 U/L (ref 11–51)

## 2022-08-07 MED ORDER — ALBUTEROL SULFATE HFA 108 (90 BASE) MCG/ACT IN AERS: 2.0000 | INHALATION_SPRAY | Freq: Four times a day (QID) | RESPIRATORY_TRACT | 2 refills | Status: DC | PRN

## 2022-08-07 MED ORDER — DOXYCYCLINE MONOHYDRATE 100 MG PO TABS: 100.0000 mg | ORAL_TABLET | Freq: Two times a day (BID) | ORAL | 0 refills | Status: DC

## 2022-08-07 MED ORDER — AMOXICILLIN-POT CLAVULANATE 875-125 MG PO TABS: 1.0000 | ORAL_TABLET | Freq: Two times a day (BID) | ORAL | 0 refills | Status: DC

## 2022-08-07 MED ORDER — DOXYCYCLINE MONOHYDRATE 100 MG PO TABS: 100.0000 mg | ORAL_TABLET | Freq: Two times a day (BID) | ORAL | 0 refills | Status: AC

## 2022-08-07 MED ORDER — AMOXICILLIN-POT CLAVULANATE 875-125 MG PO TABS
1.0000 | ORAL_TABLET | Freq: Once | ORAL | Status: AC
  Administered 2022-08-07: 1 via ORAL
  Filled 2022-08-07: qty 1

## 2022-08-07 MED ORDER — DOXYCYCLINE HYCLATE 100 MG PO TABS
100.0000 mg | ORAL_TABLET | Freq: Once | ORAL | Status: AC
  Administered 2022-08-07: 100 mg via ORAL
  Filled 2022-08-07: qty 1

## 2022-08-07 MED ORDER — AMOXICILLIN-POT CLAVULANATE 875-125 MG PO TABS: 1.0000 | ORAL_TABLET | Freq: Two times a day (BID) | ORAL | 0 refills | Status: AC

## 2022-08-07 NOTE — ED Provider Notes (Signed)
[facility] Provider Note    Event Date/Time   First MD Initiated Contact with Patient 08/07/22 1223     (approximate)   History   Cough and Back Pain   HPI  Cheyenne Hanson is a 66 y.o. female   Past medical history of hyperlipidemia and prior pneumonia presents to the emergency department with 1 week of nasal congestion, then 3 days of cough, 1 day of subjective fever, and this morning developed intermittent severe left-sided flank pain.  No history of kidney stones.  No dysuria hematuria frequency.  No vaginal bleeding or discharge.  Pain is intermittent and severe.    Pain has subsided at this time.  Her chief complaint and concern at this time is whether she has developed pneumonia with her respiratory symptoms.  History was obtained via the patient. Her husband is available as independent historian at bedside to corroborate information given above. I reviewed an external medical note including emergency department visit in August 2021 when she presented with URI symptoms and was diagnosed with bacterial sinusitis Augmentin     Physical Exam   Triage Vital Signs: ED Triage Vitals  Enc Vitals Group     BP 08/07/22 1203 126/78     Pulse Rate 08/07/22 1203 (!) 104     Resp 08/07/22 1203 20     Temp 08/07/22 1203 98.8 F (37.1 C)     Temp Source 08/07/22 1203 Oral     SpO2 08/07/22 1203 95 %     Weight 08/07/22 1204 140 lb (63.5 kg)     Height 08/07/22 1204 '5\' 8"'$  (1.727 m)     Head Circumference --      Peak Flow --      Pain Score 08/07/22 1204 7     Pain Loc --      Pain Edu? --      Excl. in [place]? --     Most recent vital signs: Vitals:   08/07/22 1203  BP: 126/78  Pulse: (!) 104  Resp: 20  Temp: 98.8 F (37.1 C)  SpO2: 95%    General: Awake, no distress.  CV:  Good peripheral perfusion.  Resp:  Normal effort.  Abd:  No distention.  Other:  Awake alert oriented pleasant nontoxic-appearing lung sounds with good air movement and  a very scant wheeze at the apices bilaterally without rhonchi rales or focalities.   ED Results / Procedures / Treatments   Labs (all labs ordered are listed, but only abnormal results are displayed) Labs Reviewed  RESP PANEL BY RT-PCR (RSV, FLU A&B, COVID)  RVPGX2 - Abnormal; Notable for the following components:      Result Value   Resp Syncytial Virus by PCR POSITIVE (*)    All other components within normal limits  COMPREHENSIVE METABOLIC PANEL - Abnormal; Notable for the following components:   Sodium 134 (*)    Alkaline Phosphatase 34 (*)    All other components within normal limits  CBC WITH DIFFERENTIAL/PLATELET - Abnormal; Notable for the following components:   WBC 15.1 (*)    Neutro Abs 13.4 (*)    All other components within normal limits  URINALYSIS, ROUTINE W REFLEX MICROSCOPIC - Abnormal; Notable for the following components:   Color, Urine YELLOW (*)    APPearance CLEAR (*)    All other components within normal limits  LIPASE, BLOOD     I reviewed labs and they are notable for positive for RSV and elevated white  blood cell count of 15.  RADIOLOGY I independently reviewed and interpreted x-ray of the chest and see patchiness in the right and left lung fields   PROCEDURES:  Critical Care performed: No  Procedures   MEDICATIONS ORDERED IN ED: Medications  amoxicillin-clavulanate (AUGMENTIN) 875-125 MG per tablet 1 tablet (has no administration in time range)  doxycycline (VIBRA-TABS) tablet 100 mg (has no administration in time range)    Consultants:  IMPRESSION / MDM / ASSESSMENT AND PLAN / ED COURSE  I reviewed the triage vital signs and the nursing notes.                              Differential diagnosis includes, but is not limited to, viral illness, bacterial pneumonia, intra-abdominal infection, renal stone, urinary tract infection, vascular    MDM: Patient with upper respiratory tract infectious symptoms and evidence of patchiness on  x-ray and partially viewed groundglass opacities in the left lower lung seen on CT scan concerning for community-acquired pneumonia.  Also her RSV is positive.  Will cover her for pneumonia with antibiotics and anticipatory guidance/supportive care for her RSV.  Prescribed albuterol inhaler for wheezing.  Fortunately the CT of the abdomen and pelvis shows no emergent pathology explaining her intermittent pain, may be related to lung infection.  She is stable for discharge with plan above and will return with any worsening or new signs symptoms.  Follow-up with PMD.  Patient's presentation is most consistent with acute presentation with potential threat to life or bodily function.       FINAL CLINICAL IMPRESSION(S) / ED DIAGNOSES   Final diagnoses:  RSV (respiratory syncytial virus infection)  Community acquired pneumonia of left lung, unspecified part of lung     Rx / DC Orders   ED Discharge Orders          Ordered    amoxicillin-clavulanate (AUGMENTIN) 875-125 MG tablet  2 times daily        08/07/22 1332    doxycycline (ADOXA) 100 MG tablet  2 times daily        08/07/22 1332    albuterol (VENTOLIN HFA) 108 (90 Base) MCG/ACT inhaler  Every 6 hours PRN        08/07/22 1332             Note:  This document was prepared using Dragon voice recognition software and may include unintentional dictation errors.    Lucillie Garfinkel, MD 08/07/22 [phone]

## 2022-08-07 NOTE — Discharge Instructions (Signed)
Take antibiotics for pneumonia. Use inhaler 2 puffs every 4-6 hours as needed for shortness of breath and wheezing.  Thank you for choosing [place] for your health care today!  Please see your primary doctor this week for a follow up appointment.   Sometimes, in the early stages of certain disease courses it is difficult to detect in the emergency department evaluation -- so, it is important that you continue to monitor your symptoms and call your doctor right away or return to the emergency department if you develop any new or worsening symptoms.  Please go to the following website to schedule new (and existing) patient appointments:   [email or web address]  If you do not have a primary doctor try calling the following clinics to establish care:  If you have insurance:  [facility] [phone] [place] [address]   [facility]  [phone] [address] [place] [address]   If you do not have insurance:  [facility]  [phone] [address] [place] [address]   The following is another list of primary care offices in the area who are accepting new patients at this time.  Please reach out to one of them directly and let them know you would like to schedule an appointment to follow up on an Emergency Department visit, and/or to establish a new primary care provider (PCP).  There are likely other primary care clinics in the are who are accepting new patients, but this is an excellent place to start:  [facility] physician: Dr Lavon Paganini [address] [place], [place] [address] [phone]  [facility] Lead Physician: Dr Steele Sizer [address], [place], [place] [address] [phone]  [place] Physician: Dr Park Liter [address] [place], [place] [address] [phone]  [facility] Lead Physician: Dr Dewaine Oats [place], [place], [place] [address] [phone]  [facility] Physician: Dr Halina Maidens [address] [place], [place] [address] [phone]   It was my pleasure to care for you today.   Hoover Brunette Jacelyn Grip, MD

## 2022-08-07 NOTE — ED Triage Notes (Signed)
Pt was sent by [facility] and states she started with a cold last Saturday- pt states she has a cough, congestion- pt states today she has started with L back pain and is concerned for a kidney stone

## 2022-08-08 ENCOUNTER — Ambulatory Visit: Payer: Self-pay

## 2022-08-11 ENCOUNTER — Telehealth: Payer: Self-pay

## 2022-08-11 NOTE — Telephone Encounter (Signed)
     Patient  visit on 12/16  at [facility]    Have you been able to follow up with your primary care physician? Yes   The patient was or was not able to obtain any needed medicine or equipment. Yes   Are there diet recommendations that you are having difficulty following? Na   Patient expresses understanding of discharge instructions and education provided has no other needs at this time.  Yes      [place], [facility], Care Management  [phone] [address], [place], [place] [address] Phone: [phone] Email: [email or web address]

## 2022-08-13 ENCOUNTER — Encounter: Payer: Self-pay | Admitting: Family Medicine

## 2022-08-13 ENCOUNTER — Ambulatory Visit (INDEPENDENT_AMBULATORY_CARE_PROVIDER_SITE_OTHER): Payer: Medicare Other | Admitting: Family Medicine

## 2022-08-13 VITALS — BP 114/62 | HR 79 | Temp 98.6°F | Ht 68.0 in | Wt 143.8 lb

## 2022-08-13 DIAGNOSIS — J210 Acute bronchiolitis due to respiratory syncytial virus: Secondary | ICD-10-CM

## 2022-08-13 DIAGNOSIS — D72829 Elevated white blood cell count, unspecified: Secondary | ICD-10-CM

## 2022-08-13 LAB — CBC WITH DIFFERENTIAL/PLATELET
Absolute Monocytes: 561 cells/uL (ref 200–950)
Basophils Absolute: 63 cells/uL (ref 0–200)
Basophils Relative: 0.8 %
Eosinophils Absolute: 221 cells/uL (ref 15–500)
Eosinophils Relative: 2.8 %
HCT: 39.3 % (ref 35.0–45.0)
Hemoglobin: 13.4 g/dL (ref 11.7–15.5)
Lymphs Abs: 1991 cells/uL (ref 850–3900)
MCH: 30.4 pg (ref 27.0–33.0)
MCHC: 34.1 g/dL (ref 32.0–36.0)
MCV: 89.1 fL (ref 80.0–100.0)
MPV: 9 fL (ref 7.5–12.5)
Monocytes Relative: 7.1 %
Neutro Abs: 5064 cells/uL (ref 1500–7800)
Neutrophils Relative %: 64.1 %
Platelets: 330 10*3/uL (ref 140–400)
RBC: 4.41 10*6/uL (ref 3.80–5.10)
RDW: 12.6 % (ref 11.0–15.0)
Total Lymphocyte: 25.2 %
WBC: 7.9 10*3/uL (ref 3.8–10.8)

## 2022-08-13 NOTE — Progress Notes (Signed)
Subjective:    Patient ID: Cheyenne Hanson, female    DOB: [birth year], 66 y.o.   MRN: [id number]  Patient is a very pleasant 66 year old Caucasian female who recently went to the emergency room with cough and right flank pain.  CT scan in the emergency room showed no kidney stone that was normal but she did have bibasilar groundglass opacities.  Chest x-ray showed bibasilar opacities concerning for pneumonia.  She also tested positive for RSV.  Her white count was greater than 15.  She was treated supportively for RSV and also given Augmentin for possible community-acquired pneumonia.  Her fever has been gone for about 24 hours.  Her pulse oximetry is up to 100%.  She is feeling some better although she still very fatigued. Past Medical History:  Diagnosis Date   Allergy    Phreesia 11/09/2020   Anemia    Anxiety    uses klonopin sparingly (30 lasts a year)   Basal cell carcinoma    "face, back, chest" (07/21/2017)   CAP (community acquired pneumonia) 07/20/2017   GERD (gastroesophageal reflux disease)    Headache    "maybe weekly; more w/seasonal allergies" (07/21/2017)   Heart murmur    Phreesia 11/09/2020   Hyperlipidemia    Mitral valve prolapse    OSA on CPAP    cpap since 2008   Pneumonia 1990s X 1   Seasonal allergies    Past Surgical History:  Procedure Laterality Date   ANTERIOR CERVICAL DECOMP/DISCECTOMY FUSION  ?2010    Dr. Ronnald Ramp   ARTHROSCOPIC REPAIR ACL Left 1997   BACK SURGERY     BASAL CELL CARCINOMA EXCISION     "face, back, chest" (07/21/2017)   MELANOMA EXCISION  10/2019   on chest   Current Outpatient Medications on File Prior to Visit  Medication Sig Dispense Refill   albuterol (VENTOLIN HFA) 108 (90 Base) MCG/ACT inhaler Inhale 2 puffs into the lungs every 6 (six) hours as needed for wheezing or shortness of breath. 8 g 2   amoxicillin-clavulanate (AUGMENTIN) 875-125 MG tablet Take 1 tablet by mouth 2 (two) times daily for 7 days. 14 tablet 0    azelastine (ASTELIN) 0.1 % nasal spray PLACE 2 SPRAYS INTO BOTH NOSTRILS 2 (TWO) TIMES DAILY. 30 mL 11   cetirizine (ZYRTEC) 10 MG tablet Take 10 mg by mouth daily.     clonazePAM (KLONOPIN) 0.5 MG tablet Take 1 tablet (0.5 mg total) by mouth at bedtime as needed for anxiety. 30 tablet 0   doxycycline (ADOXA) 100 MG tablet Take 1 tablet (100 mg total) by mouth 2 (two) times daily for 7 days. 14 tablet 0   ezetimibe (ZETIA) 10 MG tablet Take 1 tablet (10 mg total) by mouth daily. 90 tablet 3   guaiFENesin (MUCINEX) 600 MG 12 hr tablet Take by mouth 2 (two) times daily.     methocarbamol (ROBAXIN) 500 MG tablet Take 1 tablet (500 mg total) by mouth every 6 (six) hours as needed for muscle spasms. 30 tablet 0   No current facility-administered medications on file prior to visit.   Allergies  Allergen Reactions   Latex Other (See Comments)    Blisters if she wears the gloves   Statins     Muscle aches   Social History   Socioeconomic History   Marital status: Married    Spouse name: Not on file   Number of children: Not on file   Years of education: Not on file  Highest education level: Not on file  Occupational History   Not on file  Tobacco Use   Smoking status: Never   Smokeless tobacco: Never  Vaping Use   Vaping Use: Never used  Substance and Sexual Activity   Alcohol use: Yes    Alcohol/week: 2.0 standard drinks of alcohol    Types: 2 Glasses of wine per week   Drug use: No   Sexual activity: Not Currently  Other Topics Concern   Not on file  Social History Narrative   Not on file   Social Determinants of Health   Financial Resource Strain: Not on file  Food Insecurity: Not on file  Transportation Needs: Not on file  Physical Activity: Not on file  Stress: Not on file  Social Connections: Not on file  Intimate Partner Violence: Not on file   Family History  Problem Relation Age of Onset   Diabetes Mother    Cancer Father        lung   Stroke Sister     Hyperlipidemia Sister    Diabetes Sister    Heart disease Brother    Hyperlipidemia Brother    Breast cancer Cousin    Diabetes Cousin    Diabetes Maternal Grandmother    Amblyopia Neg Hx    Blindness Neg Hx    Cataracts Neg Hx    Glaucoma Neg Hx    Macular degeneration Neg Hx    Retinal detachment Neg Hx    Strabismus Neg Hx    Retinitis pigmentosa Neg Hx    Dad also committed suicide  Review of Systems  Respiratory:  Positive for cough.   All other systems reviewed and are negative.      Objective:   Physical Exam Vitals reviewed. Exam conducted with a chaperone present.  Constitutional:      General: She is not in acute distress.    Appearance: She is well-developed. She is not diaphoretic.  HENT:     Head: Normocephalic and atraumatic.     Right Ear: Tympanic membrane, ear canal and external ear normal.     Left Ear: Tympanic membrane, ear canal and external ear normal.     Nose: Mucosal edema and congestion present. No rhinorrhea.     Right Turbinates: Swollen.     Left Turbinates: Swollen.     Right Sinus: Maxillary sinus tenderness present.     Left Sinus: Maxillary sinus tenderness present.     Mouth/Throat:     Pharynx: No oropharyngeal exudate.  Eyes:     General: No scleral icterus.       Right eye: No discharge.        Left eye: No discharge.     Conjunctiva/sclera: Conjunctivae normal.     Pupils: Pupils are equal, round, and reactive to light.  Neck:     Thyroid: No thyromegaly.     Vascular: No JVD.     Trachea: No tracheal deviation.  Cardiovascular:     Rate and Rhythm: Normal rate and regular rhythm.     Heart sounds: Normal heart sounds. No murmur heard.    No friction rub. No gallop.  Pulmonary:     Effort: Pulmonary effort is normal. No respiratory distress.     Breath sounds: No stridor. Rales present. No wheezing.  Chest:     Chest wall: No tenderness.  Abdominal:     General: Bowel sounds are normal. There is no distension.      Palpations: Abdomen is soft.  There is no mass.     Tenderness: There is no abdominal tenderness. There is no guarding or rebound.  Genitourinary:    Exam position: Lithotomy position.     Labia:        Right: No rash or lesion.        Left: No rash or lesion.      Vagina: Normal.     Cervix: No cervical motion tenderness, friability, lesion or erythema.     Uterus: Normal.   Musculoskeletal:        General: No tenderness or deformity. Normal range of motion.     Cervical back: Normal range of motion and neck supple.  Lymphadenopathy:     Cervical: No cervical adenopathy.  Skin:    General: Skin is warm.     Coloration: Skin is not pale.     Findings: No erythema or rash.  Neurological:     Mental Status: She is alert and oriented to person, place, and time.     Cranial Nerves: No cranial nerve deficit.     Motor: No abnormal muscle tone.     Coordination: Coordination normal.     Deep Tendon Reflexes: Reflexes are normal and symmetric.  Psychiatric:        Behavior: Behavior normal.        Thought Content: Thought content normal.        Judgment: Judgment normal.           Assessment & Plan:  Leukocytosis, unspecified type - Plan: CBC with Differential/Platelet  RSV (acute bronchiolitis due to respiratory syncytial virus) - Plan: DG Chest 2 View Clinically the patient looks like she is doing better.  I will repeat a CBC to ensure that her white count is trending down.  I like to repeat a chest x-ray in 2 to 3 weeks.  She still has left basilar crackles however clinically she feels like she is doing better.  Continue supportive care and tincture of time.

## 2022-09-03 ENCOUNTER — Ambulatory Visit
Admission: RE | Admit: 2022-09-03 | Discharge: 2022-09-03 | Disposition: A | Discharge status: Home / Self Care | Payer: Medicare Other | Source: Ambulatory Visit | Attending: Family Medicine | Admitting: Family Medicine

## 2022-09-03 DIAGNOSIS — R918 Other nonspecific abnormal finding of lung field: Secondary | ICD-10-CM | POA: Diagnosis not present

## 2022-09-03 DIAGNOSIS — J210 Acute bronchiolitis due to respiratory syncytial virus: Secondary | ICD-10-CM

## 2022-09-03 DIAGNOSIS — Z8701 Personal history of pneumonia (recurrent): Secondary | ICD-10-CM | POA: Diagnosis not present

## 2022-09-07 ENCOUNTER — Encounter: Payer: Self-pay | Admitting: Family Medicine

## 2022-09-28 DIAGNOSIS — H209 Unspecified iridocyclitis: Secondary | ICD-10-CM | POA: Diagnosis not present

## 2022-10-14 DIAGNOSIS — H209 Unspecified iridocyclitis: Secondary | ICD-10-CM | POA: Diagnosis not present

## 2022-10-27 ENCOUNTER — Other Ambulatory Visit: Payer: Self-pay | Admitting: Family Medicine

## 2022-10-27 MED ORDER — EZETIMIBE 10 MG PO TABS: 10.0000 mg | ORAL_TABLET | Freq: Every day | ORAL | 0 refills | Status: DC

## 2022-10-27 NOTE — Telephone Encounter (Signed)
Prescription Request  10/27/2022  LOV: 08/13/2022  What is the name of the medication or equipment?   ezetimibe (ZETIA) 10 MG tablet   Have you contacted your pharmacy to request a refill? Yes   Which pharmacy would you like this sent to?  [facility] [id number] [place], [address] [place] [address] Phone: [phone] Fax: [phone]    Patient notified that their request is being sent to the clinical staff for review and that they should receive a response within 2 business days.   Please advise pharmacist.

## 2022-10-27 NOTE — Telephone Encounter (Signed)
Requested Prescriptions  Pending Prescriptions Disp Refills   ezetimibe (ZETIA) 10 MG tablet 90 tablet 0    Sig: Take 1 tablet (10 mg total) by mouth daily.     Cardiovascular:  Antilipid - Sterol Transport Inhibitors Failed - 10/27/2022  3:59 PM      Failed - Lipid Panel in normal range within the last 12 months    Cholesterol  Date Value Ref Range Status  11/24/2021 166 <200 mg/dL Final   LDL Cholesterol (Calc)  Date Value Ref Range Status  11/24/2021 97 mg/dL (calc) Final    Comment:    Reference range: <100 . Desirable range <100 mg/dL for primary prevention;   <70 mg/dL for patients with CHD or diabetic patients  with > or = 2 CHD risk factors. Marland Kitchen LDL-C is now calculated using the Martin-Hopkins  calculation, which is a validated novel method providing  better accuracy than the Friedewald equation in the  estimation of LDL-C.  Cresenciano Genre et al. Annamaria Helling. [id number]): 2061-2068  (http://education.[facility].com/faq/FAQ164)    HDL  Date Value Ref Range Status  11/24/2021 56 > OR = 50 mg/dL Final   Triglycerides  Date Value Ref Range Status  11/24/2021 51 <150 mg/dL Final         Passed - AST in normal range and within 360 days    AST  Date Value Ref Range Status  08/07/2022 20 15 - 41 U/L Final         Passed - ALT in normal range and within 360 days    ALT  Date Value Ref Range Status  08/07/2022 14 0 - 44 U/L Final         Passed - Patient is not pregnant      Passed - Valid encounter within last 12 months    Recent Outpatient Visits           9 months ago [place] Pickard, Cammie Mcgee, MD   11 months ago Postmenopausal estrogen deficiency   [place], Cammie Mcgee, MD   1 year ago General medical exam   [place] Susy Frizzle, MD   2 years ago Encounter for immunization   [facility] [place], Dionne Bucy, MD   3 years ago Nonintractable  headache, unspecified chronicity pattern, unspecified headache type   [place], [place]ena Nunnery, MD

## 2022-11-30 ENCOUNTER — Encounter: Payer: Self-pay | Admitting: Family Medicine

## 2022-11-30 ENCOUNTER — Ambulatory Visit (INDEPENDENT_AMBULATORY_CARE_PROVIDER_SITE_OTHER): Payer: Medicare Other | Admitting: Family Medicine

## 2022-11-30 VITALS — BP 120/70 | HR 65 | Temp 97.7°F | Ht 68.0 in | Wt 148.2 lb

## 2022-11-30 DIAGNOSIS — E7800 Pure hypercholesterolemia, unspecified: Secondary | ICD-10-CM | POA: Diagnosis not present

## 2022-11-30 DIAGNOSIS — Z23 Encounter for immunization: Secondary | ICD-10-CM

## 2022-11-30 DIAGNOSIS — Z0001 Encounter for general adult medical examination with abnormal findings: Secondary | ICD-10-CM | POA: Diagnosis not present

## 2022-11-30 DIAGNOSIS — Z0000 Encounter for general adult medical examination without abnormal findings: Secondary | ICD-10-CM

## 2022-11-30 MED ORDER — AZELASTINE HCL 0.1 % NA SOLN: NASAL | 3 refills | Status: DC

## 2022-11-30 MED ORDER — METHOCARBAMOL 500 MG PO TABS: 500.0000 mg | ORAL_TABLET | Freq: Four times a day (QID) | ORAL | 0 refills | Status: AC | PRN

## 2022-11-30 MED ORDER — EZETIMIBE 10 MG PO TABS: 10.0000 mg | ORAL_TABLET | Freq: Every day | ORAL | 3 refills | Status: DC

## 2022-11-30 NOTE — Addendum Note (Signed)
Addended by: Venia Carbon K on: 11/30/2022 09:30 AM   Modules accepted: Orders

## 2022-11-30 NOTE — Progress Notes (Signed)
Subjective:    Patient ID: Cheyenne Hanson, female    DOB: 01-Dec-1955, 67 y.o.   MRN: [id number]  HPI Patient is a very pleasant 67 year old Caucasian female here today for a complete physical exam.  Past medical history is significant for melanoma on her chest that was removed by her dermatologist.  She follows up with them regularly.  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  She has some resultant right arm numbness and tingling both in the distribution of the median nerve and ulnar nerve but this does not bother her and she prefers not to pursue additional work-up at this time. Her last colonoscopy was around 2017 and is up-to-date.  Patient is due for her mammogram.  Her Pap smear is not due until next year.  Her last Pap smear was normal.  She denies any falls or depression or memory loss.  She does need Prevnar 20.  The remainder of her immunizations are up-to-date Past Medical History:  Diagnosis Date   Allergy    [employer]reesia 11/09/2020   Anemia    Anxiety    uses klonopin sparingly (30 lasts a year)   Basal cell carcinoma    "face, back, chest" (07/21/2017)   CAP (community acquired pneumonia) 07/20/2017   GERD (gastroesophageal reflux disease)    Headache    "maybe weekly; more w/seasonal allergies" (07/21/2017)   Heart murmur    [employer]reesia 11/09/2020   Hyperlipidemia    Mitral valve prolapse    OSA on CPAP    cpap since 2008   Pneumonia 1990s X 1   Seasonal allergies    Past Surgical History:  Procedure Laterality Date   ANTERIOR CERVICAL DECOMP/DISCECTOMY FUSION  ?2010    Dr. Yetta Barre   ARTHROSCOPIC REPAIR ACL Left 1997   BACK SURGERY     BASAL CELL CARCINOMA EXCISION     "face, back, chest" (07/21/2017)   MELANOMA EXCISION  10/2019   on chest   Current Outpatient Medications on File Prior to Visit  Medication Sig Dispense Refill   albuterol (VENTOLIN HFA) 108 (90 Base) MCG/ACT inhaler Inhale 2 puffs into the lungs  every 6 (six) hours as needed for wheezing or shortness of breath. 8 g 2   calcium carbonate (OSCAL) 1500 (600 Ca) MG TABS tablet Take by mouth 2 (two) times daily with a meal.     cetirizine (ZYRTEC) 10 MG tablet Take 10 mg by mouth daily.     cholecalciferol (VITAMIN D3) 25 MCG (1000 UNIT) tablet Take 1,000 Units by mouth daily.     clonazePAM (KLONOPIN) 0.5 MG tablet Take 1 tablet (0.5 mg total) by mouth at bedtime as needed for anxiety. 30 tablet 0   guaiFENesin (MUCINEX) 600 MG 12 hr tablet Take by mouth 2 (two) times daily.     No current facility-administered medications on file prior to visit.   Allergies  Allergen Reactions   Latex Other (See Comments)    Blisters if she wears the gloves   Statins     Muscle aches   Social History   Socioeconomic History   Marital status: Married    Spouse name: Not on file   Number of children: Not on file   Years of education: Not on file   Highest education level: Not on file  Occupational History   Not on file  Tobacco Use   Smoking status: Never   Smokeless tobacco: Never  Vaping Use   Vaping Use: Never used  Substance and Sexual Activity   Alcohol use: Yes    Alcohol/week: 2.0 standard drinks of alcohol    Types: 2 Glasses of wine per week   Drug use: No   Sexual activity: Not Currently  Other Topics Concern   Not on file  Social History Narrative   Not on file   Social Determinants of Health   Financial Resource Strain: Not on file  Food Insecurity: Not on file  Transportation Needs: Not on file  Physical Activity: Not on file  Stress: Not on file  Social Connections: Not on file  Intimate Partner Violence: Not on file   Family History  Problem Relation Age of Onset   Diabetes Mother    Cancer Father        lung   Stroke Sister    Hyperlipidemia Sister    Diabetes Sister    Heart disease Brother    Hyperlipidemia Brother    Breast cancer Cousin    Diabetes Cousin    Diabetes Maternal Grandmother     Amblyopia Neg Hx    Blindness Neg Hx    Cataracts Neg Hx    Glaucoma Neg Hx    Macular degeneration Neg Hx    Retinal detachment Neg Hx    Strabismus Neg Hx    Retinitis pigmentosa Neg Hx    Dad also committed suicide  Review of Systems  All other systems reviewed and are negative.      Objective:   Physical Exam Vitals reviewed.  Constitutional:      General: She is not in acute distress.    Appearance: She is well-developed. She is not diaphoretic.  HENT:     Head: Normocephalic and atraumatic.     Right Ear: External ear normal.     Left Ear: External ear normal.     Nose: Nose normal.     Mouth/Throat:     Pharynx: No oropharyngeal exudate.  Eyes:     General: No scleral icterus.       Right eye: No discharge.        Left eye: No discharge.     Conjunctiva/sclera: Conjunctivae normal.     Pupils: Pupils are equal, round, and reactive to light.  Neck:     Thyroid: No thyromegaly.     Vascular: No JVD.     Trachea: No tracheal deviation.  Cardiovascular:     Rate and Rhythm: Normal rate and regular rhythm.     Heart sounds: Normal heart sounds. No murmur heard.    No friction rub. No gallop.  Pulmonary:     Effort: Pulmonary effort is normal. No respiratory distress.     Breath sounds: Normal breath sounds. No stridor. No wheezing or rales.  Chest:     Chest wall: No tenderness.  Abdominal:     General: Bowel sounds are normal. There is no distension.     Palpations: Abdomen is soft. There is no mass.     Tenderness: There is no abdominal tenderness. There is no guarding or rebound.  Musculoskeletal:        General: No tenderness or deformity. Normal range of motion.     Cervical back: Normal range of motion and neck supple.  Lymphadenopathy:     Cervical: No cervical adenopathy.  Skin:    General: Skin is warm.     Coloration: Skin is not pale.     Findings: No erythema or rash.  Neurological:     Mental Status: She is alert and oriented to person,  place, and time.     Cranial Nerves: No cranial nerve deficit.     Motor: No abnormal muscle tone.     Coordination: Coordination normal.     Deep Tendon Reflexes: Reflexes are normal and symmetric.  Psychiatric:        Behavior: Behavior normal.        Thought Content: Thought content normal.        Judgment: Judgment normal.           Assessment & Plan:  Pure hypercholesterolemia - Plan: CBC with Differential/Platelet, COMPLETE METABOLIC PANEL WITH GFR, Lipid panel  General medical exam For physical exam is outstanding today.  Blood pressure is excellent.  Check CBC CMP and lipid panel.  Patient received Prevnar 20.  The remainder of her immunizations are up-to-date.  Her Pap smear and colonoscopy are up-to-date.  I recommended that she schedule a mammogram.

## 2022-12-01 LAB — CBC WITH DIFFERENTIAL/PLATELET
Absolute Monocytes: 382 cells/uL (ref 200–950)
Basophils Absolute: 53 cells/uL (ref 0–200)
Basophils Relative: 1 %
Eosinophils Absolute: 122 cells/uL (ref 15–500)
Eosinophils Relative: 2.3 %
HCT: 41.5 % (ref 35.0–45.0)
Hemoglobin: 13.7 g/dL (ref 11.7–15.5)
Lymphs Abs: 1553 cells/uL (ref 850–3900)
MCH: 30.1 pg (ref 27.0–33.0)
MCHC: 33 g/dL (ref 32.0–36.0)
MCV: 91.2 fL (ref 80.0–100.0)
MPV: 9.9 fL (ref 7.5–12.5)
Monocytes Relative: 7.2 %
Neutro Abs: 3191 cells/uL (ref 1500–7800)
Neutrophils Relative %: 60.2 %
Platelets: 212 10*3/uL (ref 140–400)
RBC: 4.55 10*6/uL (ref 3.80–5.10)
RDW: 13.1 % (ref 11.0–15.0)
Total Lymphocyte: 29.3 %
WBC: 5.3 10*3/uL (ref 3.8–10.8)

## 2022-12-01 LAB — COMPLETE METABOLIC PANEL WITH GFR
AG Ratio: 1.9 (calc) (ref 1.0–2.5)
ALT: 19 U/L (ref 6–29)
AST: 20 U/L (ref 10–35)
Albumin: 4.4 g/dL (ref 3.6–5.1)
Alkaline phosphatase (APISO): 39 U/L (ref 37–153)
BUN: 14 mg/dL (ref 7–25)
CO2: 30 mmol/L (ref 20–32)
Calcium: 9.7 mg/dL (ref 8.6–10.4)
Chloride: 104 mmol/L (ref 98–110)
Creat: 0.71 mg/dL (ref 0.50–1.05)
Globulin: 2.3 g/dL (calc) (ref 1.9–3.7)
Glucose, Bld: 97 mg/dL (ref 65–99)
Potassium: 4.6 mmol/L (ref 3.5–5.3)
Sodium: 140 mmol/L (ref 135–146)
Total Bilirubin: 0.4 mg/dL (ref 0.2–1.2)
Total Protein: 6.7 g/dL (ref 6.1–8.1)
eGFR: 94 mL/min/{1.73_m2} (ref >=60)

## 2022-12-01 LAB — LIPID PANEL
Cholesterol: 193 mg/dL (ref <200)
HDL: 73 mg/dL (ref >=50)
LDL Cholesterol (Calc): 106 mg/dL (calc) — ABNORMAL HIGH
Non-HDL Cholesterol (Calc): 120 mg/dL (calc) (ref <130)
Total CHOL/HDL Ratio: 2.6 (calc) (ref <5.0)
Triglycerides: 58 mg/dL (ref <150)

## 2022-12-27 DIAGNOSIS — H04123 Dry eye syndrome of bilateral lacrimal glands: Secondary | ICD-10-CM | POA: Diagnosis not present

## 2022-12-27 DIAGNOSIS — H40012 Open angle with borderline findings, low risk, left eye: Secondary | ICD-10-CM | POA: Diagnosis not present

## 2022-12-27 DIAGNOSIS — H1045 Other chronic allergic conjunctivitis: Secondary | ICD-10-CM | POA: Diagnosis not present

## 2022-12-27 DIAGNOSIS — H2513 Age-related nuclear cataract, bilateral: Secondary | ICD-10-CM | POA: Diagnosis not present

## 2023-03-07 DIAGNOSIS — D2271 Melanocytic nevi of right lower limb, including hip: Secondary | ICD-10-CM | POA: Diagnosis not present

## 2023-03-07 DIAGNOSIS — L820 Inflamed seborrheic keratosis: Secondary | ICD-10-CM | POA: Diagnosis not present

## 2023-03-07 DIAGNOSIS — C44519 Basal cell carcinoma of skin of other part of trunk: Secondary | ICD-10-CM | POA: Diagnosis not present

## 2023-03-07 DIAGNOSIS — D2272 Melanocytic nevi of left lower limb, including hip: Secondary | ICD-10-CM | POA: Diagnosis not present

## 2023-03-07 DIAGNOSIS — L821 Other seborrheic keratosis: Secondary | ICD-10-CM | POA: Diagnosis not present

## 2023-03-07 DIAGNOSIS — L570 Actinic keratosis: Secondary | ICD-10-CM | POA: Diagnosis not present

## 2023-03-07 DIAGNOSIS — D2261 Melanocytic nevi of right upper limb, including shoulder: Secondary | ICD-10-CM | POA: Diagnosis not present

## 2023-03-31 ENCOUNTER — Ambulatory Visit: Payer: Medicare Other

## 2023-04-05 ENCOUNTER — Other Ambulatory Visit: Payer: Self-pay | Admitting: Family Medicine

## 2023-04-05 DIAGNOSIS — Z1231 Encounter for screening mammogram for malignant neoplasm of breast: Secondary | ICD-10-CM

## 2023-04-07 ENCOUNTER — Ambulatory Visit (INDEPENDENT_AMBULATORY_CARE_PROVIDER_SITE_OTHER): Payer: Medicare Other

## 2023-04-07 VITALS — Ht 68.0 in | Wt 148.0 lb

## 2023-04-07 DIAGNOSIS — Z0000 Encounter for general adult medical examination without abnormal findings: Secondary | ICD-10-CM | POA: Diagnosis not present

## 2023-04-07 NOTE — Patient Instructions (Addendum)
Cheyenne Hanson , Thank you for taking time to come for your Medicare Wellness Visit. I appreciate your ongoing commitment to your health goals. Please review the following plan we discussed and let me know if I can assist you in the future.   Referrals/Orders/Follow-Ups/Clinician Recommendations: Aim for 30 minutes of exercise or brisk walking, 6-8 glasses of water, and 5 servings of fruits and vegetables each day.  This is a list of the screening recommended for you and due dates:  Health Maintenance  Topic Date Due   COVID-19 Vaccine (5 - 2023-24 season) 08/08/2022   Flu Shot  03/24/2023   Hepatitis C Screening  07/02/2023*   Mammogram  11/03/2023   Medicare Annual Wellness Visit  04/06/2024   Colon Cancer Screening  08/23/2025   DTaP/Tdap/Td vaccine (2 - Td or Tdap) 12/18/2026   Pneumonia Vaccine  Completed   DEXA scan (bone density measurement)  Completed   Zoster (Shingles) Vaccine  Completed   HPV Vaccine  Aged Out  *Topic was postponed. The date shown is not the original due date.    Advanced directives: (ACP Link)Information on Advanced Care Planning can be found at [facility] Advance Health Care Directives Advance Health Care Directives ([email or web address]   Next Medicare Annual Wellness Visit scheduled for next year: Yes  Preventive Care 65 Years and Older, Female Preventive care refers to lifestyle choices and visits with your health care provider that can promote health and wellness. What does preventive care include? A yearly physical exam. This is also called an annual well check. Dental exams once or twice a year. Routine eye exams. Ask your health care provider how often you should have your eyes checked. Personal lifestyle choices, including: Daily care of your teeth and gums. Regular physical activity. Eating a healthy diet. Avoiding tobacco and drug use. Limiting alcohol use. Practicing safe sex. Taking low-dose aspirin every day. Taking vitamin  and mineral supplements as recommended by your health care provider. What happens during an annual well check? The services and screenings done by your health care provider during your annual well check will depend on your age, overall health, lifestyle risk factors, and family history of disease. Counseling  Your health care provider may ask you questions about your: Alcohol use. Tobacco use. Drug use. Emotional well-being. Home and relationship well-being. Sexual activity. Eating habits. History of falls. Memory and ability to understand (cognition). Work and work Astronomer. Reproductive health. Screening  You may have the following tests or measurements: Height, weight, and BMI. Blood pressure. Lipid and cholesterol levels. These may be checked every 5 years, or more frequently if you are over 46 years old. Skin check. Lung cancer screening. You may have this screening every year starting at age 58 if you have a 30-pack-year history of smoking and currently smoke or have quit within the past 15 years. Fecal occult blood test (FOBT) of the stool. You may have this test every year starting at age 71. Flexible sigmoidoscopy or colonoscopy. You may have a sigmoidoscopy every 5 years or a colonoscopy every 10 years starting at age 51. Hepatitis C blood test. Hepatitis B blood test. Sexually transmitted disease (STD) testing. Diabetes screening. This is done by checking your blood sugar (glucose) after you have not eaten for a while (fasting). You may have this done every 1-3 years. Bone density scan. This is done to screen for osteoporosis. You may have this done starting at age 79. Mammogram. This may be done every 1-2 years.  Talk to your health care provider about how often you should have regular mammograms. Talk with your health care provider about your test results, treatment options, and if necessary, the need for more tests. Vaccines  Your health care provider may recommend  certain vaccines, such as: Influenza vaccine. This is recommended every year. Tetanus, diphtheria, and acellular pertussis (Tdap, Td) vaccine. You may need a Td booster every 10 years. Zoster vaccine. You may need this after age 12. Pneumococcal 13-valent conjugate (PCV13) vaccine. One dose is recommended after age 67. Pneumococcal polysaccharide (PPSV23) vaccine. One dose is recommended after age 54. Talk to your health care provider about which screenings and vaccines you need and how often you need them. This information is not intended to replace advice given to you by your health care provider. Make sure you discuss any questions you have with your health care provider. Document Released: 09/05/2015 Document Revised: 04/28/2016 Document Reviewed: 06/10/2015 Elsevier Interactive Patient Education  2017 [employer].  Fall Prevention in the Home Falls can cause injuries. They can happen to people of all ages. There are many things you can do to make your home safe and to help prevent falls. What can I do on the outside of my home? Regularly fix the edges of walkways and driveways and fix any cracks. Remove anything that might make you trip as you walk through a door, such as a raised step or threshold. Trim any bushes or trees on the path to your home. Use bright outdoor lighting. Clear any walking paths of anything that might make someone trip, such as rocks or tools. Regularly check to see if handrails are loose or broken. Make sure that both sides of any steps have handrails. Any raised decks and porches should have guardrails on the edges. Have any leaves, snow, or ice cleared regularly. Use sand or salt on walking paths during winter. Clean up any spills in your garage right away. This includes oil or grease spills. What can I do in the bathroom? Use night lights. Install grab bars by the toilet and in the tub and shower. Do not use towel bars as grab bars. Use non-skid mats or  decals in the tub or shower. If you need to sit down in the shower, use a plastic, non-slip stool. Keep the floor dry. Clean up any water that spills on the floor as soon as it happens. Remove soap buildup in the tub or shower regularly. Attach bath mats securely with double-sided non-slip rug tape. Do not have throw rugs and other things on the floor that can make you trip. What can I do in the bedroom? Use night lights. Make sure that you have a light by your bed that is easy to reach. Do not use any sheets or blankets that are too big for your bed. They should not hang down onto the floor. Have a firm chair that has side arms. You can use this for support while you get dressed. Do not have throw rugs and other things on the floor that can make you trip. What can I do in the kitchen? Clean up any spills right away. Avoid walking on wet floors. Keep items that you use a lot in easy-to-reach places. If you need to reach something above you, use a strong step stool that has a grab bar. Keep electrical cords out of the way. Do not use floor polish or wax that makes floors slippery. If you must use wax, use non-skid floor wax.  Do not have throw rugs and other things on the floor that can make you trip. What can I do with my stairs? Do not leave any items on the stairs. Make sure that there are handrails on both sides of the stairs and use them. Fix handrails that are broken or loose. Make sure that handrails are as long as the stairways. Check any carpeting to make sure that it is firmly attached to the stairs. Fix any carpet that is loose or worn. Avoid having throw rugs at the top or bottom of the stairs. If you do have throw rugs, attach them to the floor with carpet tape. Make sure that you have a light switch at the top of the stairs and the bottom of the stairs. If you do not have them, ask someone to add them for you. What else can I do to help prevent falls? Wear shoes that: Do not  have high heels. Have rubber bottoms. Are comfortable and fit you well. Are closed at the toe. Do not wear sandals. If you use a stepladder: Make sure that it is fully opened. Do not climb a closed stepladder. Make sure that both sides of the stepladder are locked into place. Ask someone to hold it for you, if possible. Clearly mark and make sure that you can see: Any grab bars or handrails. First and last steps. Where the edge of each step is. Use tools that help you move around (mobility aids) if they are needed. These include: Canes. Walkers. Scooters. Crutches. Turn on the lights when you go into a dark area. Replace any light bulbs as soon as they burn out. Set up your furniture so you have a clear path. Avoid moving your furniture around. If any of your floors are uneven, fix them. If there are any pets around you, be aware of where they are. Review your medicines with your doctor. Some medicines can make you feel dizzy. This can increase your chance of falling. Ask your doctor what other things that you can do to help prevent falls. This information is not intended to replace advice given to you by your health care provider. Make sure you discuss any questions you have with your health care provider. Document Released: 06/05/2009 Document Revised: 01/15/2016 Document Reviewed: 09/13/2014 Elsevier Interactive Patient Education  2017 [employer].

## 2023-04-07 NOTE — Progress Notes (Signed)
Subjective:   Cheyenne Hanson is a 67 y.o. female who presents for an Initial Medicare Annual Wellness Visit.  Visit Complete: Virtual  I connected with  Cheyenne Hanson on 04/07/23 by a audio enabled telemedicine application and verified that I am speaking with the correct person using two identifiers.  Patient Location: Home  Provider Location: Home Office  I discussed the limitations of evaluation and management by telemedicine. The patient expressed understanding and agreed to proceed.  Patient Medicare AWV questionnaire was completed by the patient on 04/05/23; I have confirmed that all information answered by patient is correct and no changes since this date.  Vital Signs: Because this visit was a virtual/telehealth visit, some criteria may be missing or patient reported. Any vitals not documented were not able to be obtained and vitals that have been documented are patient reported.   Review of Systems     Cardiac Risk Factors include: advanced age (>63men, >75 women);dyslipidemia     Objective:    Today's Vitals   04/07/23 1421  Weight: 148 lb (67.1 kg)  Height: 5\' 8"  (1.727 m)   Body mass index is 22.5 kg/m.     04/07/2023    2:11 PM 08/07/2022   12:04 PM 07/21/2017   10:51 PM 07/20/2017    9:31 PM  Advanced Directives  Does Patient Have a Medical Advance Directive? No Yes Yes No  Type of Advance Directive  Living will Living will   Does patient want to make changes to medical advance directive?   No - Patient declined   Would patient like information on creating a medical advance directive? Yes (MAU/Ambulatory/Procedural Areas - Information given)       Current Medications (verified) Outpatient Encounter Medications as of 04/07/2023  Medication Sig   azelastine (ASTELIN) 0.1 % nasal spray PLACE 2 SPRAYS INTO BOTH NOSTRILS 2 (TWO) TIMES DAILY.   calcium carbonate (OSCAL) 1500 (600 Ca) MG TABS tablet Take by mouth 2 (two) times daily with a meal.   cetirizine  (ZYRTEC) 10 MG tablet Take 10 mg by mouth daily.   cholecalciferol (VITAMIN D3) 25 MCG (1000 UNIT) tablet Take 1,000 Units by mouth daily.   clonazePAM (KLONOPIN) 0.5 MG tablet Take 1 tablet (0.5 mg total) by mouth at bedtime as needed for anxiety.   ezetimibe (ZETIA) 10 MG tablet Take 1 tablet (10 mg total) by mouth daily.   guaiFENesin (MUCINEX) 600 MG 12 hr tablet Take by mouth 2 (two) times daily.   methocarbamol (ROBAXIN) 500 MG tablet Take 1 tablet (500 mg total) by mouth every 6 (six) hours as needed for muscle spasms.   [DISCONTINUED] albuterol (VENTOLIN HFA) 108 (90 Base) MCG/ACT inhaler Inhale 2 puffs into the lungs every 6 (six) hours as needed for wheezing or shortness of breath.   No facility-administered encounter medications on file as of 04/07/2023.    Allergies (verified) Latex and Statins   History: Past Medical History:  Diagnosis Date   Allergy    [employer]reesia 11/09/2020   Anemia    Anxiety    uses klonopin sparingly (30 lasts a year)   Basal cell carcinoma    "face, back, chest" (07/21/2017)   CAP (community acquired pneumonia) 07/20/2017   GERD (gastroesophageal reflux disease)    Headache    "maybe weekly; more w/seasonal allergies" (07/21/2017)   Heart murmur    Phreesia 11/09/2020   Hyperlipidemia    Mitral valve prolapse    OSA on CPAP    cpap since  2008   Pneumonia 1990s X 1   Seasonal allergies    Past Surgical History:  Procedure Laterality Date   ANTERIOR CERVICAL DECOMP/DISCECTOMY FUSION  ?2010    Dr. Yetta Barre   ARTHROSCOPIC REPAIR ACL Left 1997   BACK SURGERY     BASAL CELL CARCINOMA EXCISION     "face, back, chest" (07/21/2017)   MELANOMA EXCISION  10/2019   on chest   Family History  Problem Relation Age of Onset   Diabetes Mother    Cancer Father        lung   Stroke Sister    Hyperlipidemia Sister    Diabetes Sister    Heart disease Brother    Hyperlipidemia Brother    Breast cancer Cousin    Diabetes Cousin    Diabetes Maternal  Grandmother    Amblyopia Neg Hx    Blindness Neg Hx    Cataracts Neg Hx    Glaucoma Neg Hx    Macular degeneration Neg Hx    Retinal detachment Neg Hx    Strabismus Neg Hx    Retinitis pigmentosa Neg Hx    Social History   Socioeconomic History   Marital status: Married    Spouse name: Not on file   Number of children: Not on file   Years of education: Not on file   Highest education level: Not on file  Occupational History   Not on file  Tobacco Use   Smoking status: Never   Smokeless tobacco: Never  Vaping Use   Vaping status: Never Used  Substance and Sexual Activity   Alcohol use: Yes    Alcohol/week: 2.0 standard drinks of alcohol    Types: 2 Glasses of wine per week   Drug use: No   Sexual activity: Not Currently  Other Topics Concern   Not on file  Social History Narrative   Not on file   Social Determinants of Health   Financial Resource Strain: Low Risk  (04/05/2023)   Overall Financial Resource Strain (CARDIA)    Difficulty of Paying Living Expenses: Not very hard  Food Insecurity: No Food Insecurity (04/05/2023)   Hunger Vital Sign    Worried About Running Out of Food in the Last Year: Never true    Ran Out of Food in the Last Year: Never true  Transportation Needs: No Transportation Needs (04/05/2023)   PRAPARE - Administrator, Civil Service (Medical): No    Lack of Transportation (Non-Medical): No  Physical Activity: Inactive (04/05/2023)   Exercise Vital Sign    Days of Exercise per Week: 0 days    Minutes of Exercise per Session: 0 min  Stress: No Stress Concern Present (04/05/2023)   [employer] of Occupational Health - Occupational Stress Questionnaire    Feeling of Stress : Only a little  Social Connections: Moderately Isolated (04/05/2023)   Social Connection and Isolation Panel [NHANES]    Frequency of Communication with Friends and Family: More than three times a week    Frequency of Social Gatherings with Friends and Family:  Twice a week    Attends Religious Services: Never    Database administrator or Organizations: No    Attends Engineer, structural: Never    Marital Status: Married    Tobacco Counseling Counseling given: Not Answered   Clinical Intake:  Pre-visit preparation completed: Yes  Pain : No/denies pain     Diabetes: No  How often do you need to have someone  help you when you read instructions, pamphlets, or other written materials from your doctor or pharmacy?: 1 - Never  Interpreter Needed?: No  Information entered by :: Kandis Fantasia LPN   Activities of Daily Living    04/05/2023    4:45 PM  In your present state of health, do you have any difficulty performing the following activities:  Hearing? 0  Vision? 0  Difficulty concentrating or making decisions? 0  Walking or climbing stairs? 0  Dressing or bathing? 0  Doing errands, shopping? 0  Preparing Food and eating ? N  Using the Toilet? N  In the past six months, have you accidently leaked urine? N  Do you have problems with loss of bowel control? N  Managing your Medications? N  Managing your Finances? N  Housekeeping or managing your Housekeeping? N    Patient Care Team: Donita Brooks, MD as PCP - General (Family Medicine)  Indicate any recent Medical Services you may have received from other than [facility] providers in the past year (date may be approximate).     Assessment:   This is a routine wellness examination for Murriel.  Hearing/Vision screen Hearing Screening - Comments:: Denies hearing difficulties   Vision Screening - Comments::  up to date with routine eye exams with Dr. Dione Booze    Dietary issues and exercise activities discussed:     Goals Addressed             This Visit's Progress    Remain active and independent         Depression Screen    04/07/2023    2:05 PM 11/30/2022    8:26 AM 07/01/2022   11:03 AM 01/12/2022    9:26 AM 11/24/2021    9:40 AM 11/10/2020   10:27 AM  11/15/2017   12:19 PM  PHQ 2/9 Scores  PHQ - 2 Score 0 0 0 0 0 0 0  PHQ- 9 Score 0  2  0      Fall Risk    04/05/2023    4:45 PM 11/30/2022    8:26 AM 01/12/2022    9:26 AM 11/24/2021    9:40 AM 11/10/2020   10:27 AM  Fall Risk   Falls in the past year? 0 0 0  0  Number falls in past yr: 0 0  0   Injury with Fall? 0 0  0   Risk for fall due to : No Fall Risks No Fall Risks   No Fall Risks  Follow up Falls prevention discussed;Education provided;Falls evaluation completed Falls prevention discussed   Falls evaluation completed    MEDICARE RISK AT HOME:   TIMED UP AND GO:  Was the test performed? No    Cognitive Function:        04/07/2023    2:11 PM  6CIT Screen  What Year? 0 points  What month? 0 points  What time? 0 points  Count back from 20 0 points  Months in reverse 0 points  Repeat phrase 0 points  Total Score 0 points    Immunizations Immunization History  Administered Date(s) Administered   Fluad Quad(high Dose 65+) 06/13/2022   Influenza,inj,Quad PF,6+ Mos 04/26/2019   Influenza-Unspecified 05/19/2017   PFIZER(Purple Top)SARS-COV-2 Vaccination 10/07/2019, 10/31/2019, 06/23/2020   PNEUMOCOCCAL CONJUGATE-20 11/30/2022   Pneumococcal Polysaccharide-23 07/22/2017   Tdap 12/17/2016   Unspecified SARS-COV-2 Vaccination 06/13/2022   Zoster Recombinant(Shingrix) 07/16/2017, 11/30/2017    TDAP status: Up to date  Flu Vaccine status: Due,  Education has been provided regarding the importance of this vaccine. Advised may receive this vaccine at local pharmacy or Health Dept. Aware to provide a copy of the vaccination record if obtained from local pharmacy or Health Dept. Verbalized acceptance and understanding.  Pneumococcal vaccine status: Up to date  Covid-19 vaccine status: Information provided on how to obtain vaccines.   Qualifies for Shingles Vaccine? Yes   Zostavax completed  No    Shingrix Completed?: Yes  Screening Tests Health Maintenance  Topic  Date Due   COVID-19 Vaccine (5 - 2023-24 season) 08/08/2022   INFLUENZA VACCINE  03/24/2023   Hepatitis C Screening  07/02/2023 (Originally 01/14/1974)   MAMMOGRAM  11/03/2023   Medicare Annual Wellness (AWV)  04/06/2024   Colonoscopy  08/23/2025   DTaP/Tdap/Td (2 - Td or Tdap) 12/18/2026   Pneumonia Vaccine 85+ Years old  Completed   DEXA SCAN  Completed   Zoster Vaccines- Shingrix  Completed   HPV VACCINES  Aged Out    Health Maintenance  Health Maintenance Due  Topic Date Due   COVID-19 Vaccine (5 - 2023-24 season) 08/08/2022   INFLUENZA VACCINE  03/24/2023    Colorectal cancer screening: Type of screening: Colonoscopy. Completed 08/24/15. Repeat every 10 years  Mammogram status: Ordered and scheduled for 04/19/23 . Pt provided with contact info and advised to call to schedule appt.   Bone Density status: Completed 12/10/21. Results reflect: Bone density results: OSTEOPENIA. Repeat every 2 years.  Lung Cancer Screening: (Low Dose CT Chest recommended if Age 12-80 years, 20 pack-year currently smoking OR have quit w/in 15years.) does not qualify.   Lung Cancer Screening Referral: n/a  Additional Screening:  Hepatitis C Screening: does qualify; Declines at this time  Vision Screening: Recommended annual ophthalmology exams for early detection of glaucoma and other disorders of the eye. Is the patient up to date with their annual eye exam?  Yes  Who is the provider or what is the name of the office in which the patient attends annual eye exams? [facility] If pt is not established with a provider, would they like to be referred to a provider to establish care? No .   Dental Screening: Recommended annual dental exams for proper oral hygiene  Community Resource Referral / Chronic Care Management: CRR required this visit?  No   CCM required this visit?  No     Plan:     I have personally reviewed and noted the following in the patient's chart:   Medical and social  history Use of alcohol, tobacco or illicit drugs  Current medications and supplements including opioid prescriptions. Patient is not currently taking opioid prescriptions. Functional ability and status Nutritional status Physical activity Advanced directives List of other physicians Hospitalizations, surgeries, and ER visits in previous 12 months Vitals Screenings to include cognitive, depression, and falls Referrals and appointments  In addition, I have reviewed and discussed with patient certain preventive protocols, quality metrics, and best practice recommendations. A written personalized care plan for preventive services as well as general preventive health recommendations were provided to patient.     Kandis Fantasia [place], [place]   [id number]   After Visit Summary: (MyChart) Due to this being a telephonic visit, the after visit summary with patients personalized plan was offered to patient via MyChart   Nurse Notes: No concerns at this time

## 2023-04-19 ENCOUNTER — Ambulatory Visit: Admission: RE | Admit: 2023-04-19 | Payer: Medicare Other | Source: Ambulatory Visit

## 2023-04-19 DIAGNOSIS — Z1231 Encounter for screening mammogram for malignant neoplasm of breast: Secondary | ICD-10-CM

## 2023-08-02 ENCOUNTER — Telehealth: Payer: Self-pay

## 2023-08-02 NOTE — Telephone Encounter (Signed)
I do not see a sleep study report in the patient's chart.   Copied from CRM [id number]. Topic: Clinical - Prescription Issue >> Aug 02, 2023  9:36 AM Dimitri Ped wrote: Reason for CRM:  Ms. Marisol/ [employer] Callback Number: [phone] calling cause missing supplies and clinical notes . The pressure settings and humidifier for prescription for a cpap

## 2023-08-25 ENCOUNTER — Ambulatory Visit: Payer: Self-pay | Admitting: Family Medicine

## 2023-08-25 NOTE — Telephone Encounter (Signed)
 Copied from CRM [id number]. Topic: Clinical - Red Word Triage >> Aug 25, 2023  8:16 AM Carlatta H wrote: [facility] that prompted transfer to Nurse Triage: Patient has had head congestion since Monday//She has blood in her mucus when she blows her nose//   Chief Complaint: Sinus pain/congestion Symptoms: Yellow Frequency: x 4 days Pertinent Negatives: Patient denies Vomiting, Diarrhea, Chest pain, Abdominal Pain, or Ear aches but she does have pressure in her ears Disposition: [] ED /[] Urgent Care (no appt availability in office) / [x] Appointment(In office/virtual)/ []  [place] [facility]/ [] Home Care/ [] Refused Recommended Disposition /[] [place] [facility]/ []  Follow-up with PCP Additional Notes: Patient called and advised with sinus pressure/pain and now has yellow phlegm and some blood when she blows her nose.  Patient denies any chest pain, abdominal pain, vomiting, diarrhea, or earaches at this time. She does state that she has pressure in her ears though.  Patient had taken the RSV vaccine and Flu Vaccine.  Patient states that she has been taking Mucinex  DM and using a NettiPot but she is now starting to have sinus headaches and yellow mucous with some blood in it when she blows her nose.  She states that she does not want this to turn into pneumonia and if her PCP wants to review her information and send in a prescription without seeing her in the office she would be fine with that if it was possible.  She also wouldn't mind coming in sooner than the appointment this RN scheduled for her tomorrow 08/26/2023 at 11am with her PCP.  Patient taken a COVID test this morning and that was negative.  Patient states her best call back number is [phone]. Patient is advised that if things get worse to go to an urgent care or the emergency room.  Patient verbalized understanding.  Reason for Disposition  [1] Using nasal washes and pain medicine > 24 hours AND [2] sinus pain (around cheekbone or eye)  persists  Answer Assessment - Initial Assessment Questions 1. LOCATION: Where does it hurt?      Sinus pain 2. ONSET: When did the sinus pain start?  (e.g., hours, days)      X 4 days ago 3. SEVERITY: How bad is the pain?   (Scale 1-10; mild, moderate or severe)   - MILD (1-3): doesn't interfere with normal activities    - MODERATE (4-7): interferes with normal activities (e.g., work or school) or awakens from sleep   - SEVERE (8-10): excruciating pain and patient unable to do any normal activities        7 4. RECURRENT SYMPTOM: Have you ever had sinus problems before? If Yes, ask: When was the last time? and What happened that time?      Yes--last year and she had RSV 5. NASAL CONGESTION: Is the nose blocked? If Yes, ask: Can you open it or must you breathe through your mouth?     Yes 6. NASAL DISCHARGE: Do you have discharge from your nose? If so ask, What color?     Yellow with some blood here and there 7. FEVER: Do you have a fever? If Yes, ask: What is it, how was it measured, and when did it start?      Patient thinks low grade at times 8. OTHER SYMPTOMS: Do you have any other symptoms? (e.g., sore throat, cough, earache, difficulty breathing)     Cough--taking Mucinex  DM, Pressure in ears, Pain all around sinuses around teeth  Protocols used: Sinus Pain  or Congestion-A-AH

## 2023-08-26 ENCOUNTER — Ambulatory Visit (INDEPENDENT_AMBULATORY_CARE_PROVIDER_SITE_OTHER): Payer: Medicare Other | Admitting: Family Medicine

## 2023-08-26 VITALS — BP 120/72 | HR 77 | Temp 98.4°F | Ht 68.0 in | Wt 154.0 lb

## 2023-08-26 DIAGNOSIS — J329 Chronic sinusitis, unspecified: Secondary | ICD-10-CM | POA: Diagnosis not present

## 2023-08-26 MED ORDER — AMOXICILLIN-POT CLAVULANATE 875-125 MG PO TABS: 1.0000 | ORAL_TABLET | Freq: Two times a day (BID) | ORAL | 0 refills | Status: DC

## 2023-08-26 MED ORDER — PREDNISONE 20 MG PO TABS: ORAL_TABLET | ORAL | 0 refills | Status: DC

## 2023-08-26 NOTE — Progress Notes (Signed)
 Subjective:    Patient ID: Cheyenne Hanson, female    DOB: 1956/07/11, 68 y.o.   MRN: [id number]  Patient reports a 7-day history of severe pain in both maxillary sinuses.  She states that her teeth are hurting.  She states she feels like she needs to pull one of her teeth due to the pain.  She reports bloody purulent nasal discharge.  She reports head congestion and pain and tenderness in both maxillary sinuses.  She reports a headache.  She reports subjective fevers.  She also reports postnasal drip causing a cough with wheezing. Past Medical History:  Diagnosis Date   Allergy    Phreesia 11/09/2020   Anemia    Anxiety    uses klonopin  sparingly (30 lasts a year)   Basal cell carcinoma    face, back, chest (07/21/2017)   CAP (community acquired pneumonia) 07/20/2017   GERD (gastroesophageal reflux disease)    Headache    maybe weekly; more w/seasonal allergies (07/21/2017)   Heart murmur    Phreesia 11/09/2020   Hyperlipidemia    Mitral valve prolapse    OSA on CPAP    cpap since 2008   Pneumonia 1990s X 1   Seasonal allergies    Past Surgical History:  Procedure Laterality Date   ANTERIOR CERVICAL DECOMP/DISCECTOMY FUSION  ?2010    Dr. Joshua   ARTHROSCOPIC REPAIR ACL Left 1997   BACK SURGERY     BASAL CELL CARCINOMA EXCISION     face, back, chest (07/21/2017)   MELANOMA EXCISION  10/2019   on chest   Current Outpatient Medications on File Prior to Visit  Medication Sig Dispense Refill   azelastine  (ASTELIN ) 0.1 % nasal spray PLACE 2 SPRAYS INTO BOTH NOSTRILS 2 (TWO) TIMES DAILY. 90 mL 3   calcium carbonate (OSCAL) 1500 (600 Ca) MG TABS tablet Take by mouth 2 (two) times daily with a meal.     cetirizine (ZYRTEC) 10 MG tablet Take 10 mg by mouth daily.     cholecalciferol (VITAMIN D3) 25 MCG (1000 UNIT) tablet Take 1,000 Units by mouth daily.     clonazePAM  (KLONOPIN ) 0.5 MG tablet Take 1 tablet (0.5 mg total) by mouth at bedtime as needed for anxiety. 30 tablet  0   ezetimibe  (ZETIA ) 10 MG tablet Take 1 tablet (10 mg total) by mouth daily. 90 tablet 3   guaiFENesin  (MUCINEX ) 600 MG 12 hr tablet Take by mouth 2 (two) times daily.     methocarbamol  (ROBAXIN ) 500 MG tablet Take 1 tablet (500 mg total) by mouth every 6 (six) hours as needed for muscle spasms. 30 tablet 0   No current facility-administered medications on file prior to visit.   Allergies  Allergen Reactions   Latex Other (See Comments)    Blisters if she wears the gloves   Statins     Muscle aches   Social History   Socioeconomic History   Marital status: Married    Spouse name: Not on file   Number of children: Not on file   Years of education: Not on file   Highest education level: Associate degree: occupational, scientist, product/process development, or vocational program  Occupational History   Not on file  Tobacco Use   Smoking status: Never   Smokeless tobacco: Never  Vaping Use   Vaping status: Never Used  Substance and Sexual Activity   Alcohol use: Yes    Alcohol/week: 2.0 standard drinks of alcohol    Types: 2 Glasses of wine per  week   Drug use: No   Sexual activity: Not Currently  Other Topics Concern   Not on file  Social History Narrative   Not on file   Social Drivers of Health   Financial Resource Strain: Low Risk  (08/26/2023)   Overall Financial Resource Strain (CARDIA)    Difficulty of Paying Living Expenses: Not hard at all  Food Insecurity: No Food Insecurity (08/26/2023)   Hunger Vital Sign    Worried About Running Out of Food in the Last Year: Never true    Ran Out of Food in the Last Year: Never true  Transportation Needs: No Transportation Needs (08/26/2023)   PRAPARE - Administrator, Civil Service (Medical): No    Lack of Transportation (Non-Medical): No  Physical Activity: Insufficiently Active (08/26/2023)   Exercise Vital Sign    Days of Exercise per Week: 2 days    Minutes of Exercise per Session: 20 min  Stress: No Stress Concern Present (08/26/2023)    [employer] of Occupational Health - Occupational Stress Questionnaire    Feeling of Stress : Only a little  Social Connections: Unknown (08/26/2023)   Social Connection and Isolation Panel [NHANES]    Frequency of Communication with Friends and Family: More than three times a week    Frequency of Social Gatherings with Friends and Family: Once a week    Attends Religious Services: Patient declined    Database Administrator or Organizations: No    Attends Banker Meetings: Never    Marital Status: Married  Catering Manager Violence: Not At Risk (04/07/2023)   Humiliation, Afraid, Rape, and Kick questionnaire    Fear of Current or Ex-Partner: No    Emotionally Abused: No    Physically Abused: No    Sexually Abused: No   Family History  Problem Relation Age of Onset   Diabetes Mother    Cancer Father        lung   Stroke Sister    Hyperlipidemia Sister    Diabetes Sister    Heart disease Brother    Hyperlipidemia Brother    Breast cancer Cousin    Diabetes Cousin    Diabetes Maternal Grandmother    Amblyopia Neg Hx    Blindness Neg Hx    Cataracts Neg Hx    Glaucoma Neg Hx    Macular degeneration Neg Hx    Retinal detachment Neg Hx    Strabismus Neg Hx    Retinitis pigmentosa Neg Hx    Dad also committed suicide  Review of Systems  Respiratory:  Positive for cough.   All other systems reviewed and are negative.      Objective:   Physical Exam Vitals reviewed. Exam conducted with a chaperone present.  Constitutional:      General: She is not in acute distress.    Appearance: She is well-developed. She is not diaphoretic.  HENT:     Head: Normocephalic and atraumatic.     Right Ear: Tympanic membrane, ear canal and external ear normal.     Left Ear: Tympanic membrane, ear canal and external ear normal.     Nose: Mucosal edema, congestion and rhinorrhea present.     Right Turbinates: Swollen.     Left Turbinates: Swollen.     Right Sinus:  Maxillary sinus tenderness present.     Left Sinus: Maxillary sinus tenderness present.     Mouth/Throat:     Pharynx: No oropharyngeal exudate.  Eyes:  General: No scleral icterus.       Right eye: No discharge.        Left eye: No discharge.     Conjunctiva/sclera: Conjunctivae normal.     Pupils: Pupils are equal, round, and reactive to light.  Neck:     Thyroid : No thyromegaly.     Vascular: No JVD.     Trachea: No tracheal deviation.  Cardiovascular:     Rate and Rhythm: Normal rate and regular rhythm.     Heart sounds: Normal heart sounds. No murmur heard.    No friction rub. No gallop.  Pulmonary:     Effort: Pulmonary effort is normal. No respiratory distress.     Breath sounds: Normal breath sounds. No stridor. No wheezing or rales.  Chest:     Chest wall: No tenderness.  Abdominal:     General: Bowel sounds are normal. There is no distension.     Palpations: Abdomen is soft. There is no mass.     Tenderness: There is no abdominal tenderness. There is no guarding or rebound.  Genitourinary:    Exam position: Lithotomy position.     Labia:        Right: No rash or lesion.        Left: No rash or lesion.      Vagina: Normal.     Cervix: No cervical motion tenderness, friability, lesion or erythema.     Uterus: Normal.   Musculoskeletal:        General: No tenderness or deformity. Normal range of motion.     Cervical back: Normal range of motion and neck supple.  Lymphadenopathy:     Cervical: No cervical adenopathy.  Skin:    General: Skin is warm.     Coloration: Skin is not pale.     Findings: No erythema or rash.  Neurological:     Mental Status: She is alert and oriented to person, place, and time.     Cranial Nerves: No cranial nerve deficit.     Motor: No abnormal muscle tone.     Coordination: Coordination normal.     Deep Tendon Reflexes: Reflexes are normal and symmetric.  Psychiatric:        Behavior: Behavior normal.        Thought Content:  Thought content normal.        Judgment: Judgment normal.           Assessment & Plan:  Rhinosinusitis  Patient has a sinus infection.  Begin Augmentin  875 mg twice daily for 10 days and then recheck if no better in 1 week.  Add prednisone  for bronchospasm and wheezing.

## 2023-10-04 DIAGNOSIS — L570 Actinic keratosis: Secondary | ICD-10-CM | POA: Diagnosis not present

## 2023-10-04 DIAGNOSIS — L820 Inflamed seborrheic keratosis: Secondary | ICD-10-CM | POA: Diagnosis not present

## 2023-12-01 ENCOUNTER — Telehealth: Payer: Self-pay | Admitting: Family Medicine

## 2023-12-01 ENCOUNTER — Other Ambulatory Visit: Payer: Medicare Other

## 2023-12-01 DIAGNOSIS — D72829 Elevated white blood cell count, unspecified: Secondary | ICD-10-CM | POA: Diagnosis not present

## 2023-12-01 DIAGNOSIS — E7800 Pure hypercholesterolemia, unspecified: Secondary | ICD-10-CM | POA: Diagnosis not present

## 2023-12-01 DIAGNOSIS — Z0000 Encounter for general adult medical examination without abnormal findings: Secondary | ICD-10-CM

## 2023-12-01 NOTE — Telephone Encounter (Signed)
 Patient dropped off copy of her sleep study report as requested by provider to write script for her Cpap machine. Copy placed on nurse's desk.  Patient is requesting for the C-Pap script to be sent to [facility].  Patient stated she will keep her preferred pharmacy for med refills as [facility] [address].   Please advise patient at [phone].

## 2023-12-02 LAB — COMPLETE METABOLIC PANEL WITHOUT GFR
AG Ratio: 2.2 (calc) (ref 1.0–2.5)
ALT: 12 U/L (ref 6–29)
AST: 15 U/L (ref 10–35)
Albumin: 4.4 g/dL (ref 3.6–5.1)
Alkaline phosphatase (APISO): 37 U/L (ref 37–153)
BUN: 13 mg/dL (ref 7–25)
CO2: 30 mmol/L (ref 20–32)
Calcium: 9.4 mg/dL (ref 8.6–10.4)
Chloride: 102 mmol/L (ref 98–110)
Creat: 0.73 mg/dL (ref 0.50–1.05)
Globulin: 2 g/dL (ref 1.9–3.7)
Glucose, Bld: 92 mg/dL (ref 65–99)
Potassium: 4.2 mmol/L (ref 3.5–5.3)
Sodium: 138 mmol/L (ref 135–146)
Total Bilirubin: 0.5 mg/dL (ref 0.2–1.2)
Total Protein: 6.4 g/dL (ref 6.1–8.1)

## 2023-12-02 LAB — HEMOGLOBIN A1C
Hgb A1c MFr Bld: 6 %{Hb} — ABNORMAL HIGH (ref <5.7)
Mean Plasma Glucose: 126 mg/dL
eAG (mmol/L): 7 mmol/L

## 2023-12-02 LAB — LIPID PANEL
Cholesterol: 192 mg/dL (ref <200)
HDL: 56 mg/dL (ref >=50)
LDL Cholesterol (Calc): 118 mg/dL — ABNORMAL HIGH
Non-HDL Cholesterol (Calc): 136 mg/dL — ABNORMAL HIGH (ref <130)
Total CHOL/HDL Ratio: 3.4 (calc) (ref <5.0)
Triglycerides: 83 mg/dL (ref <150)

## 2023-12-02 LAB — CBC WITH DIFFERENTIAL/PLATELET
Absolute Lymphocytes: 1415 {cells}/uL (ref 850–3900)
Absolute Monocytes: 338 {cells}/uL (ref 200–950)
Basophils Absolute: 28 {cells}/uL (ref 0–200)
Basophils Relative: 0.6 %
Eosinophils Absolute: 118 {cells}/uL (ref 15–500)
Eosinophils Relative: 2.5 %
HCT: 40.2 % (ref 35.0–45.0)
Hemoglobin: 13.2 g/dL (ref 11.7–15.5)
MCH: 30.3 pg (ref 27.0–33.0)
MCHC: 32.8 g/dL (ref 32.0–36.0)
MCV: 92.2 fL (ref 80.0–100.0)
MPV: 9.8 fL (ref 7.5–12.5)
Monocytes Relative: 7.2 %
Neutro Abs: 2801 {cells}/uL (ref 1500–7800)
Neutrophils Relative %: 59.6 %
Platelets: 226 10*3/uL (ref 140–400)
RBC: 4.36 10*6/uL (ref 3.80–5.10)
RDW: 12.3 % (ref 11.0–15.0)
Total Lymphocyte: 30.1 %
WBC: 4.7 10*3/uL (ref 3.8–10.8)

## 2023-12-05 ENCOUNTER — Encounter: Payer: Medicare Other | Admitting: Family Medicine

## 2023-12-09 ENCOUNTER — Other Ambulatory Visit: Payer: Self-pay

## 2023-12-15 ENCOUNTER — Encounter: Payer: Self-pay | Admitting: Family Medicine

## 2023-12-15 ENCOUNTER — Ambulatory Visit: Payer: Medicare Other | Admitting: Family Medicine

## 2023-12-15 VITALS — BP 118/68 | HR 61 | Ht 68.0 in | Wt 155.8 lb

## 2023-12-15 DIAGNOSIS — J329 Chronic sinusitis, unspecified: Secondary | ICD-10-CM | POA: Diagnosis not present

## 2023-12-15 DIAGNOSIS — Z0001 Encounter for general adult medical examination with abnormal findings: Secondary | ICD-10-CM | POA: Diagnosis not present

## 2023-12-15 DIAGNOSIS — Z0000 Encounter for general adult medical examination without abnormal findings: Secondary | ICD-10-CM

## 2023-12-15 MED ORDER — EZETIMIBE 10 MG PO TABS: 10.0000 mg | ORAL_TABLET | Freq: Every day | ORAL | 3 refills | Status: AC

## 2023-12-15 MED ORDER — AZELASTINE HCL 0.1 % NA SOLN: NASAL | 3 refills | Status: AC

## 2023-12-15 NOTE — Progress Notes (Signed)
 Subjective:    Patient ID: Cheyenne Hanson, female    DOB: 10/29/1955, 68 y.o.   MRN: [id number]  HPI Patient is a very pleasant 68 year old Caucasian female here today for a complete physical exam.  Past medical history is significant for melanoma on her chest that was removed by her dermatologist.  She follows up with them regularly.  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  She believes her colonoscopy was in 2017 and is not due again until 2027.  She is due for a Pap smear today however she defers this until next year.  She is scheduled for mammogram every year in August.  Her pneumonia vaccine shingles vaccine and tetanus shot are up-to-date.  She is due for the RSV vaccine. Lab on 12/01/2023  Component Date Value Ref Range Status   Cholesterol 12/01/2023 192  <200 mg/dL Final   HDL 40/98/1191 56  > OR = 50 mg/dL Final   Triglycerides 47/82/9562 83  <150 mg/dL Final   LDL Cholesterol (Calc) 12/01/2023 118 (H)  mg/dL (calc) Final   Comment: Reference range: <100 . Desirable range <100 mg/dL for primary prevention;   <70 mg/dL for patients with CHD or diabetic patients  with > or = 2 CHD risk factors. Aaron Aas LDL-C is now calculated using the Martin-Hopkins  calculation, which is a validated novel method providing  better accuracy than the Friedewald equation in the  estimation of LDL-C.  Melinda Sprawls et al. Erroll Heard. 1308;657(84): 2061-2068  (http://education.[facility].com/faq/FAQ164)    Total CHOL/HDL Ratio 12/01/2023 3.4  <6.9 (calc) Final   Non-HDL Cholesterol (Calc) 12/01/2023 136 (H)  <130 mg/dL (calc) Final   Comment: For patients with diabetes plus 1 major ASCVD risk  factor, treating to a non-HDL-C goal of <100 mg/dL  (LDL-C of <62 mg/dL) is considered a therapeutic  option.    WBC 12/01/2023 4.7  3.8 - 10.8 Thousand/uL Final   RBC 12/01/2023 4.36  3.80 - 5.10 Million/uL Final   Hemoglobin 12/01/2023 13.2  11.7 - 15.5 g/dL  Final   HCT 95/28/4132 40.2  35.0 - 45.0 % Final   MCV 12/01/2023 92.2  80.0 - 100.0 fL Final   MCH 12/01/2023 30.3  27.0 - 33.0 pg Final   MCHC 12/01/2023 32.8  32.0 - 36.0 g/dL Final   Comment: For adults, a slight decrease in the calculated MCHC value (in the range of 30 to 32 g/dL) is most likely not clinically significant; however, it should be interpreted with caution in correlation with other red cell parameters and the patient's clinical condition.    RDW 12/01/2023 12.3  11.0 - 15.0 % Final   Platelets 12/01/2023 226  140 - 400 Thousand/uL Final   MPV 12/01/2023 9.8  7.5 - 12.5 fL Final   Neutro Abs 12/01/2023 2,801  1,500 - 7,800 cells/uL Final   Absolute Lymphocytes 12/01/2023 1,415  850 - 3,900 cells/uL Final   Absolute Monocytes 12/01/2023 338  200 - 950 cells/uL Final   Eosinophils Absolute 12/01/2023 118  15 - 500 cells/uL Final   Basophils Absolute 12/01/2023 28  0 - 200 cells/uL Final   Neutrophils Relative % 12/01/2023 59.6  % Final   Total Lymphocyte 12/01/2023 30.1  % Final   Monocytes Relative 12/01/2023 7.2  % Final   Eosinophils Relative 12/01/2023 2.5  % Final   Basophils Relative 12/01/2023 0.6  % Final   Glucose, Bld 12/01/2023 92  65 -  99 mg/dL Final   Comment: .            Fasting reference interval .    BUN 12/01/2023 13  7 - 25 mg/dL Final   Creat 16/05/9603 0.73  0.50 - 1.05 mg/dL Final   BUN/Creatinine Ratio 12/01/2023 SEE NOTE:  6 - 22 (calc) Final   Comment:    Not Reported: BUN and Creatinine are within    reference range. .    Sodium 12/01/2023 138  135 - 146 mmol/L Final   Potassium 12/01/2023 4.2  3.5 - 5.3 mmol/L Final   Chloride 12/01/2023 102  98 - 110 mmol/L Final   CO2 12/01/2023 30  20 - 32 mmol/L Final   Calcium 12/01/2023 9.4  8.6 - 10.4 mg/dL Final   Total Protein 54/04/8118 6.4  6.1 - 8.1 g/dL Final   Albumin 14/78/2956 4.4  3.6 - 5.1 g/dL Final   Globulin 21/30/8657 2.0  1.9 - 3.7 g/dL (calc) Final   AG Ratio 12/01/2023 2.2   1.0 - 2.5 (calc) Final   Total Bilirubin 12/01/2023 0.5  0.2 - 1.2 mg/dL Final   Alkaline phosphatase (APISO) 12/01/2023 37  37 - 153 U/L Final   AST 12/01/2023 15  10 - 35 U/L Final   ALT 12/01/2023 12  6 - 29 U/L Final   Hgb A1c MFr Bld 12/01/2023 6.0 (H)  <5.7 % of total Hgb Final   Comment: For someone without known diabetes, a hemoglobin  A1c value between 5.7% and 6.4% is consistent with prediabetes and should be confirmed with a  follow-up test. . For someone with known diabetes, a value <7% indicates that their diabetes is well controlled. A1c targets should be individualized based on duration of diabetes, age, comorbid conditions, and other considerations. . This assay result is consistent with an increased risk of diabetes. . Currently, no consensus exists regarding use of hemoglobin A1c for diagnosis of diabetes for children. .    Mean Plasma Glucose 12/01/2023 126  mg/dL Final   eAG (mmol/L) 84/69/6295 7.0  mmol/L Final    Past Medical History:  Diagnosis Date   Allergy    Phreesia 11/09/2020   Anemia    Anxiety    uses klonopin  sparingly (30 lasts a year)   Basal cell carcinoma    "face, back, chest" (07/21/2017)   CAP (community acquired pneumonia) 07/20/2017   GERD (gastroesophageal reflux disease)    Headache    "maybe weekly; more w/seasonal allergies" (07/21/2017)   Heart murmur    Phreesia 11/09/2020   Hyperlipidemia    Mitral valve prolapse    OSA on CPAP    cpap since 2008   Pneumonia 1990s X 1   Seasonal allergies    Past Surgical History:  Procedure Laterality Date   ANTERIOR CERVICAL DECOMP/DISCECTOMY FUSION  ?2010    Dr. Rochelle Chu   ARTHROSCOPIC REPAIR ACL Left 1997   BACK SURGERY     BASAL CELL CARCINOMA EXCISION     "face, back, chest" (07/21/2017)   MELANOMA EXCISION  10/2019   on chest   Current Outpatient Medications on File Prior to Visit  Medication Sig Dispense Refill   calcium carbonate (OSCAL) 1500 (600 Ca) MG TABS tablet  Take by mouth 2 (two) times daily with a meal.     cetirizine (ZYRTEC) 10 MG tablet Take 10 mg by mouth daily.     cholecalciferol (VITAMIN D3) 25 MCG (1000 UNIT) tablet Take 1,000 Units by mouth daily.  clonazePAM  (KLONOPIN ) 0.5 MG tablet Take 1 tablet (0.5 mg total) by mouth at bedtime as needed for anxiety. 30 tablet 0   methocarbamol  (ROBAXIN ) 500 MG tablet Take 1 tablet (500 mg total) by mouth every 6 (six) hours as needed for muscle spasms. 30 tablet 0   guaiFENesin  (MUCINEX ) 600 MG 12 hr tablet Take by mouth 2 (two) times daily. (Patient not taking: Reported on 12/15/2023)     No current facility-administered medications on file prior to visit.   Allergies  Allergen Reactions   Latex Other (See Comments)    Blisters if she wears the gloves   Statins     Muscle aches   Social History   Socioeconomic History   Marital status: Married    Spouse name: Not on file   Number of children: Not on file   Years of education: Not on file   Highest education level: Associate degree: occupational, Scientist, product/process development, or vocational program  Occupational History   Not on file  Tobacco Use   Smoking status: Never   Smokeless tobacco: Never  Vaping Use   Vaping status: Never Used  Substance and Sexual Activity   Alcohol use: Yes    Alcohol/week: 2.0 standard drinks of alcohol    Types: 2 Glasses of wine per week   Drug use: No   Sexual activity: Not Currently  Other Topics Concern   Not on file  Social History Narrative   Not on file   Social Drivers of Health   Financial Resource Strain: Low Risk  (08/26/2023)   Overall Financial Resource Strain (CARDIA)    Difficulty of Paying Living Expenses: Not hard at all  Food Insecurity: No Food Insecurity (08/26/2023)   Hunger Vital Sign    Worried About Running Out of Food in the Last Year: Never true    Ran Out of Food in the Last Year: Never true  Transportation Needs: No Transportation Needs (08/26/2023)   PRAPARE - Scientist, research (physical sciences) (Medical): No    Lack of Transportation (Non-Medical): No  Physical Activity: Insufficiently Active (08/26/2023)   Exercise Vital Sign    Days of Exercise per Week: 2 days    Minutes of Exercise per Session: 20 min  Stress: No Stress Concern Present (08/26/2023)   [employer] of Occupational Health - Occupational Stress Questionnaire    Feeling of Stress : Only a little  Social Connections: Unknown (08/26/2023)   Social Connection and Isolation Panel [NHANES]    Frequency of Communication with Friends and Family: More than three times a week    Frequency of Social Gatherings with Friends and Family: Once a week    Attends Religious Services: Patient declined    Database administrator or Organizations: No    Attends Banker Meetings: Never    Marital Status: Married  Catering manager Violence: Not At Risk (04/07/2023)   Humiliation, Afraid, Rape, and Kick questionnaire    Fear of Current or Ex-Partner: No    Emotionally Abused: No    Physically Abused: No    Sexually Abused: No   Family History  Problem Relation Age of Onset   Diabetes Mother    Cancer Father        lung   Stroke Sister    Hyperlipidemia Sister    Diabetes Sister    Heart disease Brother    Hyperlipidemia Brother    Breast cancer Cousin    Diabetes Cousin    Diabetes Maternal  Grandmother    Amblyopia Neg Hx    Blindness Neg Hx    Cataracts Neg Hx    Glaucoma Neg Hx    Macular degeneration Neg Hx    Retinal detachment Neg Hx    Strabismus Neg Hx    Retinitis pigmentosa Neg Hx    Dad also committed suicide  Review of Systems  All other systems reviewed and are negative.      Objective:   Physical Exam Vitals reviewed.  Constitutional:      General: She is not in acute distress.    Appearance: She is well-developed. She is not diaphoretic.  HENT:     Head: Normocephalic and atraumatic.     Right Ear: External ear normal.     Left Ear: External ear normal.     Nose:  Nose normal.     Mouth/Throat:     Pharynx: No oropharyngeal exudate.  Eyes:     General: No scleral icterus.       Right eye: No discharge.        Left eye: No discharge.     Conjunctiva/sclera: Conjunctivae normal.     Pupils: Pupils are equal, round, and reactive to light.  Neck:     Thyroid: No thyromegaly.     Vascular: No JVD.     Trachea: No tracheal deviation.  Cardiovascular:     Rate and Rhythm: Normal rate and regular rhythm.     Heart sounds: Normal heart sounds. No murmur heard.    No friction rub. No gallop.  Pulmonary:     Effort: Pulmonary effort is normal. No respiratory distress.     Breath sounds: Normal breath sounds. No stridor. No wheezing or rales.  Chest:     Chest wall: No tenderness.  Abdominal:     General: Bowel sounds are normal. There is no distension.     Palpations: Abdomen is soft. There is no mass.     Tenderness: There is no abdominal tenderness. There is no guarding or rebound.  Musculoskeletal:        General: No tenderness or deformity. Normal range of motion.     Cervical back: Normal range of motion and neck supple.  Lymphadenopathy:     Cervical: No cervical adenopathy.  Skin:    General: Skin is warm.     Coloration: Skin is not pale.     Findings: No erythema or rash.  Neurological:     Mental Status: She is alert and oriented to person, place, and time.     Cranial Nerves: No cranial nerve deficit.     Motor: No abnormal muscle tone.     Coordination: Coordination normal.     Deep Tendon Reflexes: Reflexes are normal and symmetric.  Psychiatric:        Behavior: Behavior normal.        Thought Content: Thought content normal.        Judgment: Judgment normal.           Assessment & Plan:  Rhinosinusitis - Plan: azelastine  (ASTELIN ) 0.1 % nasal spray  General medical exam  Encounter for Medicare annual wellness exam Recommended RSV vaccine.  Patient will schedule her mammogram because we did not consent.  Recommended  a Pap smear but the patient defers that until next year.  Bone density test will be done next year.  Patient is taking calcium and vitamin D.  I reviewed her lab work.  Recommended a low carbohydrate diet.  Recommend that  she takes the Zetia  every day.  She is only been taking it every other day.  Otherwise regular preventative care is up-to-date.

## 2023-12-18 ENCOUNTER — Encounter: Payer: Self-pay | Admitting: Family Medicine

## 2023-12-19 ENCOUNTER — Other Ambulatory Visit: Payer: Self-pay | Admitting: Family Medicine

## 2023-12-19 ENCOUNTER — Ambulatory Visit: Attending: Family Medicine

## 2023-12-19 DIAGNOSIS — R002 Palpitations: Secondary | ICD-10-CM

## 2023-12-19 NOTE — Progress Notes (Unsigned)
 EP to read.

## 2023-12-22 ENCOUNTER — Telehealth: Payer: Self-pay

## 2023-12-22 NOTE — Telephone Encounter (Signed)
 Copied from CRM [id number]. Topic: Referral - Prior Authorization Question >> Dec 22, 2023 12:13 PM Oddis Bench wrote: Reason for CRM: Cheyenne Hanson from [facility] is calling to have the patients most recent notes about the cpap machine with the Dr. Frosty Jews to her [phone] so that insurance can approve the machine.

## 2023-12-25 ENCOUNTER — Emergency Department (HOSPITAL_COMMUNITY)
Admission: EM | Admit: 2023-12-25 | Discharge: 2023-12-25 | Disposition: A | Discharge status: Home / Self Care | Attending: Emergency Medicine | Admitting: Emergency Medicine

## 2023-12-25 ENCOUNTER — Emergency Department (HOSPITAL_COMMUNITY)

## 2023-12-25 ENCOUNTER — Other Ambulatory Visit: Payer: Self-pay

## 2023-12-25 DIAGNOSIS — I672 Cerebral atherosclerosis: Secondary | ICD-10-CM | POA: Insufficient documentation

## 2023-12-25 DIAGNOSIS — H4902 Third [oculomotor] nerve palsy, left eye: Secondary | ICD-10-CM | POA: Diagnosis not present

## 2023-12-25 DIAGNOSIS — I10 Essential (primary) hypertension: Secondary | ICD-10-CM | POA: Diagnosis not present

## 2023-12-25 DIAGNOSIS — Z85828 Personal history of other malignant neoplasm of skin: Secondary | ICD-10-CM | POA: Insufficient documentation

## 2023-12-25 DIAGNOSIS — H02402 Unspecified ptosis of left eyelid: Secondary | ICD-10-CM | POA: Diagnosis not present

## 2023-12-25 DIAGNOSIS — I6930 Unspecified sequelae of cerebral infarction: Secondary | ICD-10-CM | POA: Diagnosis not present

## 2023-12-25 DIAGNOSIS — R9431 Abnormal electrocardiogram [ECG] [EKG]: Secondary | ICD-10-CM | POA: Diagnosis not present

## 2023-12-25 DIAGNOSIS — H532 Diplopia: Secondary | ICD-10-CM | POA: Diagnosis not present

## 2023-12-25 DIAGNOSIS — E785 Hyperlipidemia, unspecified: Secondary | ICD-10-CM | POA: Insufficient documentation

## 2023-12-25 DIAGNOSIS — R29818 Other symptoms and signs involving the nervous system: Secondary | ICD-10-CM | POA: Diagnosis not present

## 2023-12-25 DIAGNOSIS — Z79899 Other long term (current) drug therapy: Secondary | ICD-10-CM | POA: Diagnosis not present

## 2023-12-25 DIAGNOSIS — R519 Headache, unspecified: Secondary | ICD-10-CM | POA: Diagnosis not present

## 2023-12-25 DIAGNOSIS — G4733 Obstructive sleep apnea (adult) (pediatric): Secondary | ICD-10-CM | POA: Diagnosis not present

## 2023-12-25 DIAGNOSIS — I739 Peripheral vascular disease, unspecified: Secondary | ICD-10-CM

## 2023-12-25 DIAGNOSIS — I6521 Occlusion and stenosis of right carotid artery: Secondary | ICD-10-CM | POA: Diagnosis not present

## 2023-12-25 DIAGNOSIS — R29810 Facial weakness: Secondary | ICD-10-CM | POA: Diagnosis not present

## 2023-12-25 DIAGNOSIS — R42 Dizziness and giddiness: Secondary | ICD-10-CM | POA: Diagnosis not present

## 2023-12-25 LAB — DIFFERENTIAL
Abs Immature Granulocytes: 0.04 10*3/uL (ref 0.00–0.07)
Basophils Absolute: 0.1 10*3/uL (ref 0.0–0.1)
Basophils Relative: 1 %
Eosinophils Absolute: 0.2 10*3/uL (ref 0.0–0.5)
Eosinophils Relative: 3 %
Immature Granulocytes: 1 %
Lymphocytes Relative: 29 %
Lymphs Abs: 1.8 10*3/uL (ref 0.7–4.0)
Monocytes Absolute: 0.4 10*3/uL (ref 0.1–1.0)
Monocytes Relative: 7 %
Neutro Abs: 3.8 10*3/uL (ref 1.7–7.7)
Neutrophils Relative %: 59 %

## 2023-12-25 LAB — COMPREHENSIVE METABOLIC PANEL WITH GFR
ALT: 14 U/L (ref 0–44)
AST: 21 U/L (ref 15–41)
Albumin: 3.8 g/dL (ref 3.5–5.0)
Alkaline Phosphatase: 40 U/L (ref 38–126)
Anion gap: 12 (ref 5–15)
BUN: 13 mg/dL (ref 8–23)
CO2: 23 mmol/L (ref 22–32)
Calcium: 9.3 mg/dL (ref 8.9–10.3)
Chloride: 103 mmol/L (ref 98–111)
Creatinine, Ser: 0.84 mg/dL (ref 0.44–1.00)
GFR, Estimated: >60 mL/min (ref >60)
Glucose, Bld: 81 mg/dL (ref 70–99)
Potassium: 4.1 mmol/L (ref 3.5–5.1)
Sodium: 138 mmol/L (ref 135–145)
Total Bilirubin: 0.7 mg/dL (ref 0.0–1.2)
Total Protein: 6.5 g/dL (ref 6.5–8.1)

## 2023-12-25 LAB — CBC
HCT: 40 % (ref 36.0–46.0)
Hemoglobin: 13.1 g/dL (ref 12.0–15.0)
MCH: 31 pg (ref 26.0–34.0)
MCHC: 32.8 g/dL (ref 30.0–36.0)
MCV: 94.6 fL (ref 80.0–100.0)
Platelets: 235 10*3/uL (ref 150–400)
RBC: 4.23 MIL/uL (ref 3.87–5.11)
RDW: 13.2 % (ref 11.5–15.5)
WBC: 6.2 10*3/uL (ref 4.0–10.5)
nRBC: 0 % (ref 0.0–0.2)

## 2023-12-25 LAB — I-STAT CHEM 8, ED
BUN: 13 mg/dL (ref 8–23)
Calcium, Ion: 1.19 mmol/L (ref 1.15–1.40)
Chloride: 102 mmol/L (ref 98–111)
Creatinine, Ser: 0.9 mg/dL (ref 0.44–1.00)
Glucose, Bld: 77 mg/dL (ref 70–99)
HCT: 40 % (ref 36.0–46.0)
Hemoglobin: 13.6 g/dL (ref 12.0–15.0)
Potassium: 4.1 mmol/L (ref 3.5–5.1)
Sodium: 139 mmol/L (ref 135–145)
TCO2: 24 mmol/L (ref 22–32)

## 2023-12-25 LAB — ETHANOL: Alcohol, Ethyl (B): <15 mg/dL (ref <15)

## 2023-12-25 LAB — APTT: aPTT: 28 s (ref 24–36)

## 2023-12-25 LAB — CBG MONITORING, ED: Glucose-Capillary: 77 mg/dL (ref 70–99)

## 2023-12-25 LAB — PROTIME-INR
INR: 1 (ref 0.8–1.2)
Prothrombin Time: 13.3 s (ref 11.4–15.2)

## 2023-12-25 MED ORDER — IOHEXOL 350 MG/ML SOLN
75.0000 mL | Freq: Once | INTRAVENOUS | Status: AC | PRN
  Administered 2023-12-25: 75 mL via INTRAVENOUS

## 2023-12-25 NOTE — ED Notes (Signed)
 Zio patch reapplied with medipore, patient advised to call office to verify plan for monitoring.

## 2023-12-25 NOTE — Discharge Instructions (Addendum)
 Patch eye as discussed Please call Dr. Marvin Slot office to be seen this week You were given referral to neurology. There is no evidence of stroke on your workup today.  You were evaluated with labs and EKG with normal labs including a complete blood cell count, electrolytes, blood sugar, and kidney function. If you are having new symptoms such as worsening vision, pain, or other new symptoms, you should return to the emergency department. You are given a referral to neurology please call for outpatient follow-up and recheck.

## 2023-12-25 NOTE — Consult Note (Addendum)
 NEUROLOGY CONSULT NOTE   Date of service: Dec 25, 2023 Patient Name: Cheyenne Hanson MRN:  [id number] DOB:  March 25, 1956 Chief Complaint: "Code stroke" Requesting Provider: No att. providers found  History of Present Illness  Cheyenne Hanson is a 68 y.o. female with hx of anxiety, basal cell carcinoma, OSA on CPAP, HTN, HLD, GERD, HLD, headaches who presents to [facility] ED via private vehicle for acute onset of double vision, ptosis of left eyelid, lightheadedness at 1000 am. Code stroke activated in triage. NIHSS 0. She denies any weakness, facial droop, slurred speech or numbness. She endorses being lightheaded. CT head with no acute process. CTA head and neck with no LVO. Patient taken to STAT MRI brain for treatment decision.  MRI negative for acute stroke   On exam she is awake and alert, follows commands, no aphasia or dysarthria, eyes are mildly dysconjugate, Pupils left eye 4 and reactive, right eye 2 and reactive, subjective double vision with lateral gaze and upward gaze with both eyes open, no double vision noted with one eye covered, left eyelid with mild ptosis. No facial asymmetry, no weakness in arms or legs, no ataxia, no sensation deficits She is wearing a cardiac monitor for ongoing lightheadedness from her PCP  LKW: 1000 Modified rankin score: 0-Completely asymptomatic and back to baseline post- stroke IV Thrombolysis:  No low NIHSS and presentation not compatible with stroke EVT:  No LVO   NIHSS components Score: Comment  1a Level of Conscious 0[x]  1[]  2[]  3[]      1b LOC Questions 0[x]  1[]  2[]       1c LOC Commands 0[x]  1[]  2[]       2 Best Gaze 0[x]  1[]  2[]       3 Visual 0[x]  1[]  2[]  3[]      4 Facial Palsy 0[x]  1[]  2[]  3[]      5a Motor Arm - left 0[x]  1[]  2[]  3[]  4[]  UN[]    5b Motor Arm - Right 0[x]  1[]  2[]  3[]  4[]  UN[]    6a Motor Leg - Left 0[x]  1[]  2[]  3[]  4[]  UN[]    6b Motor Leg - Right 0[x]  1[]  2[]  3[]  4[]  UN[]    7 Limb Ataxia 0[x]  1[]  2[]  UN[]      8 Sensory 0[x]  1[]  2[]   UN[]      9 Best Language 0[x]  1[]  2[]  3[]      10 Dysarthria 0[x]  1[]  2[]  UN[]      11 Extinct. and Inattention 0[x]  1[]  2[]       TOTAL: 0      ROS  Comprehensive ROS performed and pertinent positives documented in HPI    Past History   Past Medical History:  Diagnosis Date   Allergy    [employer] 11/09/2020   Anemia    Anxiety    uses klonopin  sparingly (30 lasts a year)   Basal cell carcinoma    "face, back, chest" (07/21/2017)   CAP (community acquired pneumonia) 07/20/2017   GERD (gastroesophageal reflux disease)    Headache    "maybe weekly; more w/seasonal allergies" (07/21/2017)   Heart murmur    [employer] 11/09/2020   Hyperlipidemia    Mitral valve prolapse    OSA on CPAP    cpap since 2008   Pneumonia 1990s X 1   Seasonal allergies     Past Surgical History:  Procedure Laterality Date   ANTERIOR CERVICAL DECOMP/DISCECTOMY FUSION  ?2010    Dr. Rochelle Chu   ARTHROSCOPIC REPAIR ACL Left 1997   BACK SURGERY     BASAL CELL CARCINOMA  EXCISION     "face, back, chest" (07/21/2017)   MELANOMA EXCISION  10/2019   on chest    Family History: Family History  Problem Relation Age of Onset   Diabetes Mother    Cancer Father        lung   Stroke Sister    Hyperlipidemia Sister    Diabetes Sister    Heart disease Brother    Hyperlipidemia Brother    Breast cancer Cousin    Diabetes Cousin    Diabetes Maternal Grandmother    Amblyopia Neg Hx    Blindness Neg Hx    Cataracts Neg Hx    Glaucoma Neg Hx    Macular degeneration Neg Hx    Retinal detachment Neg Hx    Strabismus Neg Hx    Retinitis pigmentosa Neg Hx     Social History  reports that she has never smoked. She has never used smokeless tobacco. She reports current alcohol use of about 2.0 standard drinks of alcohol per week. She reports that she does not use drugs.  Allergies  Allergen Reactions   Latex Other (See Comments)    Blisters if she wears the gloves   Statins     Muscle aches     Medications  No current facility-administered medications for this encounter.  Current Outpatient Medications:    azelastine  (ASTELIN ) 0.1 % nasal spray, PLACE 2 SPRAYS INTO BOTH NOSTRILS 2 (TWO) TIMES DAILY., Disp: 90 mL, Rfl: 3   calcium carbonate (OSCAL) 1500 (600 Ca) MG TABS tablet, Take by mouth 2 (two) times daily with a meal., Disp: , Rfl:    cetirizine (ZYRTEC) 10 MG tablet, Take 10 mg by mouth daily., Disp: , Rfl:    cholecalciferol (VITAMIN D3) 25 MCG (1000 UNIT) tablet, Take 1,000 Units by mouth daily., Disp: , Rfl:    clonazePAM  (KLONOPIN ) 0.5 MG tablet, Take 1 tablet (0.5 mg total) by mouth at bedtime as needed for anxiety., Disp: 30 tablet, Rfl: 0   ezetimibe  (ZETIA ) 10 MG tablet, Take 1 tablet (10 mg total) by mouth daily., Disp: 90 tablet, Rfl: 3   guaiFENesin  (MUCINEX ) 600 MG 12 hr tablet, Take by mouth 2 (two) times daily. (Patient not taking: Reported on 12/15/2023), Disp: , Rfl:    methocarbamol  (ROBAXIN ) 500 MG tablet, Take 1 tablet (500 mg total) by mouth every 6 (six) hours as needed for muscle spasms., Disp: 30 tablet, Rfl: 0  Vitals   Vitals:   12/25/23 1205 12/25/23 1215  BP: 135/72   Pulse: 79   Resp: 17   Temp: 97.7 F (36.5 C)   SpO2: 100%   Weight:  70.3 kg  Height:  5\' 8"  (1.727 m)    Body mass index is 23.57 kg/m.  Physical Exam   Constitutional: Appears well-developed and well-nourished.  Psych: Affect appropriate to situation.  Eyes: No scleral injection.  HENT: No OP obstruction.  Head: Normocephalic.  Cardiovascular: Normal rate and regular rhythm.  Respiratory: Effort normal, non-labored breathing.  GI: Soft.  No distension. There is no tenderness.  Skin: WDI.   Neurologic Examination   Mental Status -  Level of arousal and orientation to time, place, and person were intact. Language including expression, naming, repetition, comprehension was assessed and found intact. Attention span and concentration were normal. Recent and  remote memory were intact. Fund of Knowledge was assessed and was intact.  Cranial Nerves II - XII - II - Visual field intact OU.Pupils left eye 4 and reactive, right  eye 2 and reactive III, IV, VI - Extraocular movements intact.subjective double vision with lateral gaze and upward gaze with both eyes open, no double vision noted with one eye covered, left eyelid with ptosis.  V - Facial sensation intact bilaterally. VII - Facial movement intact bilaterally. VIII - Hearing & vestibular intact bilaterally. X - Palate elevates symmetrically. XI - Chin turning & shoulder shrug intact bilaterally. XII - Tongue protrusion intact.  Motor Strength - The patient's strength was normal in all extremities and pronator drift was absent.  Bulk was normal and fasciculations were absent.   Motor Tone - Muscle tone was assessed at the neck and appendages and was normal. Sensory - Light touch, temperature/pinprick were assessed and were symmetrical.   Coordination - The patient had normal movements in the hands and feet with no ataxia or dysmetria.  Tremor was absent. Gait and Station - deferred.  Labs/Imaging/Neurodiagnostic studies   CBC:  Recent Labs  Lab Jan 13, 2024 1222 Jan 13, 2024 1230  WBC 6.2  --   NEUTROABS 3.8  --   HGB 13.1 13.6  HCT 40.0 40.0  MCV 94.6  --   PLT 235  --    Basic Metabolic Panel:  Lab Results  Component Value Date   NA 139 13-Jan-2024   K 4.1 Jan 13, 2024   CO2 30 12/01/2023   GLUCOSE 77 Jan 13, 2024   BUN 13 2024/01/13   CREATININE 0.90 01-13-24   CALCIUM 9.4 12/01/2023   GFRNONAA >60 08/07/2022   GFRAA 88 11/10/2020   Lipid Panel:  Lab Results  Component Value Date   LDLCALC 118 (H) 12/01/2023   HgbA1c:  Lab Results  Component Value Date   HGBA1C 6.0 (H) 12/01/2023   Urine Drug Screen: No results found for: "LABOPIA", "COCAINSCRNUR", "LABBENZ", "AMPHETMU", "THCU", "LABBARB"  Alcohol Level No results found for: "ETH" INR  Lab Results  Component Value  Date   INR 1.0 Jan 13, 2024   APTT  Lab Results  Component Value Date   APTT 28 01-13-2024   AED levels: No results found for: "PHENYTOIN", "ZONISAMIDE", "LAMOTRIGINE", "LEVETIRACETA"  CT Head without contrast(Personally reviewed): With no acute process   CT angio Head and Neck with contrast(Personally reviewed): NO LVO   MRI Brain(Personally reviewed): Negative for acute stroke     ASSESSMENT   LAMYRA JOSEPHSON is a 68 y.o. female with hx of anxiety, basal cell carcinoma, OSA on CPAP, HTN, HLD, GERD, HLD, headaches who presents to [facility] ED via private vehicle for acute onset of double vision, ptosis of left eyelid, lightheadedness at 1030 am. Code stroke activated in triage. NIHSS 0.  MRI brain negative for acute stroke but shows moderate changes of chronic small vessel disease.. 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  RECOMMENDATIONS  - Code stroke cancelled as no stroke on MRI brain  - ambulate patient and evaluate gait - Maybe discharged if gait stable - Follow up with opthalmologist  ______________________________________________________________________    Signed, Laymond Priestly, NP [facility]  I have personally obtained history,examined this patient, reviewed notes, independently viewed imaging studies, participated in medical decision making and plan of care.ROS completed by me personally and pertinent positives fully documented  I have made any additions or clarifications directly to the above note. Agree with note above.  Patient presented with sudden onset of dizziness and binocular diplopia without any other lateralizing findings.  Neurological exam shows partial left 3rd nerve palsy and slight subjective diplopia and extreme lateral gaze and vertical gaze with  partial drooping of the left eye and slight enlargement of the left pupil.  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  CT angiogram shows no  large vessel stenosis, occlusion or aneurysm and MRI shows no acute infarct.  Recommend strict control of blood pressure with goal below 140/90.  Check lipid profile, hemoglobin A1c.  Occupational Therapy counseled for eyelid patch to overcome diplopia.  Follow-up as an outpatient in neurology clinic.  Long discussion with patient and husband at the bedside and answered questions.  Discussed with Dr. Synetta Eves ER MD. Greater than 50% time during this 80-minute consultation visit was spent on counseling and coordination of care about her neurological presentation and discussion about evaluation and treatment and answering questions and discussion with care team Ardella Beaver, MD Medical Director [facility] Pager: [phone] 12/25/2023 2:26 PM

## 2023-12-25 NOTE — Code Documentation (Signed)
 Stroke Response Nurse Documentation Code Documentation  Cheyenne Hanson is a 68 y.o. female arriving to [facility]  via Private Vehicle on 12/25/2023 with past medical hx of headache, OSA on CPAP, HLD, anxiety. On No antithrombotic. Code stroke was activated by EMS.   Patient from home where she was LKW at 1000 and now complaining of visual changes to her left eye.    Stroke team at the bedside on patient arrival. Labs drawn and patient cleared for CT by Dr. Ray/ C. Margit Shelling, PA. Patient to CT with team. NIHSS 0, see documentation for details and code stroke times.   The following imaging was completed:  CT Head, CTA, and MRI.   Patient is not a candidate for IV Thrombolytic due to NIH 0. Patient is not a candidate for IR due to imaging negative for LVO.   Care Plan: Code stroke cancelled   Process Delays Noted: n/a  Bedside handoff with ED- Dylan.    Faraaz Wolin L Kimara Bencomo  Rapid Response RN

## 2023-12-25 NOTE — ED Provider Notes (Signed)
 [place] EMERGENCY DEPARTMENT AT [facility] Provider Note   CSN: [id number] Arrival date & time: 12/25/23  1201  An emergency department physician performed an initial assessment on this suspected stroke patient at 73.  History  Chief Complaint  Patient presents with   Blurred Vision    headache   Headache    Cheyenne Hanson is a 68 y.o. female.  HPI 68 yo female presents lkn 1030, with diplopia and dizziness. Activated as code stroke Seen by stroke team No stroke seen on ct, or mri 68 year old female who felt lightheaded this morning.  She then looked in the mirror and noticed some drooping of her left eyelid and felt like her vision was blurring.  She has since identified some diplopia.  She reports that there was some period of time where there was a black area in the middle of her visual field.  At this time, symptoms have essentially resolved the exception she still has some double vision when looking up and to the left.      Home Medications Prior to Admission medications   Medication Sig Start Date End Date Taking? Authorizing Provider  azelastine  (ASTELIN ) 0.1 % nasal spray PLACE 2 SPRAYS INTO BOTH NOSTRILS 2 (TWO) TIMES DAILY. 12/15/23   Austine Lefort, MD  calcium carbonate (OSCAL) 1500 (600 Ca) MG TABS tablet Take by mouth 2 (two) times daily with a meal.    [provider]  cetirizine (ZYRTEC) 10 MG tablet Take 10 mg by mouth daily.    [provider]  cholecalciferol (VITAMIN D3) 25 MCG (1000 UNIT) tablet Take 1,000 Units by mouth daily.    [provider]  clonazePAM  (KLONOPIN ) 0.5 MG tablet Take 1 tablet (0.5 mg total) by mouth at bedtime as needed for anxiety. 07/01/22   Austine Lefort, MD  ezetimibe  (ZETIA ) 10 MG tablet Take 1 tablet (10 mg total) by mouth daily. 12/15/23   Austine Lefort, MD  guaiFENesin  (MUCINEX ) 600 MG 12 hr tablet Take by mouth 2 (two) times daily. Patient not taking: Reported on 12/15/2023     [provider]  methocarbamol  (ROBAXIN ) 500 MG tablet Take 1 tablet (500 mg total) by mouth every 6 (six) hours as needed for muscle spasms. 11/30/22   Austine Lefort, MD      Allergies    Latex and Statins    Review of Systems   Review of Systems  Physical Exam Updated Vital Signs BP 135/72 (BP Location: Right Arm)   Pulse 79   Temp 97.7 F (36.5 C)   Resp 17   Ht 1.727 m (5\' 8" )   Wt 70.3 kg   SpO2 100%   BMI 23.57 kg/m  Physical Exam Vitals reviewed.  HENT:     Head: Normocephalic.     Mouth/Throat:     Mouth: Mucous membranes are moist.  Eyes:     General: No visual field deficit.    Extraocular Movements: Extraocular movements intact.     Right eye: Normal extraocular motion and no nystagmus.     Left eye: Normal extraocular motion and no nystagmus.     Pupils: Pupils are equal, round, and reactive to light.  Cardiovascular:     Rate and Rhythm: Normal rate.     Heart sounds: Normal heart sounds.  Pulmonary:     Effort: Pulmonary effort is normal.  Abdominal:     Palpations: Abdomen is soft.  Musculoskeletal:        General:  Normal range of motion.     Cervical back: Normal range of motion and neck supple.  Skin:    General: Skin is warm.     Capillary Refill: Capillary refill takes less than 2 seconds.  Neurological:     Mental Status: She is alert and oriented to person, place, and time.     Sensory: No sensory deficit.     Comments: Patient with some diplopia when looking up and to left however extraocular movements appear intact There is some left eyelid drooping The no other facial asymmetry  Psychiatric:        Mood and Affect: Mood normal.     ED Results / Procedures / Treatments   Labs (all labs ordered are listed, but only abnormal results are displayed) Labs Reviewed  ETHANOL  PROTIME-INR  APTT  CBC  DIFFERENTIAL  COMPREHENSIVE METABOLIC PANEL WITH GFR  RAPID URINE DRUG SCREEN, HOSP PERFORMED  I-STAT CHEM 8, ED  CBG  MONITORING, ED    EKG EKG Interpretation Date/Time:  Sunday Dec 25 2023 13:21:20 EDT Ventricular Rate:  67 PR Interval:  163 QRS Duration:  83 QT Interval:  389 QTC Calculation: 411 R Axis:   112  Text Interpretation: Right and left arm electrode reversal, interpretation assumes no reversal Sinus rhythm Confirmed by Auston Blush [phone]) on 12/25/2023 1:39:40 PM  Radiology MR BRAIN WO CONTRAST Result Date: 12/25/2023 CLINICAL DATA:  Neuro deficit, acute, stroke suspected. Headache. Blurred vision in the left eye. Left eyelid droop. EXAM: MRI HEAD WITHOUT CONTRAST TECHNIQUE: Multiplanar, multiecho pulse sequences of the brain and surrounding structures were obtained without intravenous contrast. COMPARISON:  CT head and CTA head and neck scratched at CT head without contrast and CTA head and neck 12/25/2023 FINDINGS: Brain: No acute infarct, hemorrhage, or mass lesion is present. Scattered subcortical T2 hyperintensities bilaterally are mildly advanced for age. No significant periventricular white matter changes are present. The ventricles are of normal size. Deep brain nuclei are within normal limits. The brainstem and cerebellum are within normal limits. Midline structures are within normal limits. The internal auditory canals are within normal limits. Vascular: Flow is present in the major intracranial arteries. Skull and upper cervical spine: The craniocervical junction is normal. Upper cervical spine is within normal limits. Marrow signal is unremarkable. Sinuses/Orbits: The paranasal sinuses and mastoid air cells are clear. The globes and orbits are within normal limits. IMPRESSION: 1. No acute intracranial abnormality. 2. Scattered subcortical T2 hyperintensities bilaterally are mildly advanced for age. The finding is nonspecific but can be seen in the setting of chronic microvascular ischemia, a demyelinating process such as multiple sclerosis, vasculitis, complicated migraine headaches, or as  the sequelae of a prior infectious or inflammatory process. Electronically Signed   By: Audree Leas M.D.   On: 12/25/2023 13:20   CT ANGIO HEAD NECK W WO CM (CODE STROKE) Result Date: 12/25/2023 CLINICAL DATA:  Code stroke. 68 year old female neurologic deficit. Blurred vision, left eye droop and headache onset 1015 hours. EXAM: CT ANGIOGRAPHY HEAD AND NECK WITH AND WITHOUT CONTRAST TECHNIQUE: Multidetector CT imaging of the head and neck was performed using the standard protocol during bolus administration of intravenous contrast. Multiplanar CT image reconstructions and MIPs were obtained to evaluate the vascular anatomy. Carotid stenosis measurements (when applicable) are obtained utilizing NASCET criteria, using the distal internal carotid diameter as the denominator. RADIATION DOSE REDUCTION: This exam was performed according to the departmental dose-optimization program which includes automated exposure control, adjustment of the mA and/or  kV according to patient size and/or use of iterative reconstruction technique. CONTRAST:  75mL OMNIPAQUE IOHEXOL 350 MG/ML SOLN COMPARISON:  Plain head CT today 1235 hours. FINDINGS: CTA NECK Skeleton: C5-C6 and C6-C7 ACDF with solid arthrodesis. Possible developing ankylosis from anterior flowing endplate osteophyte at the C4-C5 adjacent segment. No acute osseous abnormality identified. Upper chest: Apical lung scarring is confluent, but otherwise upper lungs relatively well aerated. Negative visible superior mediastinum. Other neck: Nonvascular neck soft tissue spaces are within normal limits. Aortic arch: 3 vessel arch with minimal arch atherosclerosis. Right carotid system: Negative aside from a subtle beaded appearance of the cervical right ICA (series 12, image 13). No atherosclerosis or stenosis. Left carotid system: Negative aside from subtle beaded appearance of the left ICA series 12, image 25. No atherosclerosis or stenosis. Vertebral arteries: Tortuous  proximal right subclavian artery with normal right vertebral artery origin. Late entry of the right vertebral into the cervical transverse foramen, not until the C4 level. Right vertebral is patent to the skull base, relatively codominant, with no plaque or stenosis. Mildly tortuous proximal right subclavian artery. Right vertebral artery origin appears to remain normal, surrounding paravertebral venous contrast contamination including along the left V1 segment which is obscured. But otherwise the left vertebral artery appears codominant and patent to the skull base with no significant plaque or stenosis. CTA HEAD Posterior circulation: Patent distal vertebral arteries and vertebrobasilar junction. Patent PICA origins. Patent basilar artery with mild irregularity but no significant stenosis. Fetal type bilateral PCA origins. Patent SCA is. Diminutive P1 segments. Bilateral PCA branches are patent without stenosis, mildly tortuous. Anterior circulation: Both ICA siphons are patent. Minimal left siphon plaque without stenosis. Normal left posterior communicating artery origin. Right ICA siphon is ectatic, mild calcified plaque, no stenosis or aneurysm. Patent carotid termini, normal MCA and ACA origins. Diminutive or absent anterior communicating artery. Bilateral ACA branches are within normal limits. Left MCA M1 segment bifurcates early without stenosis. Left MCA branches are within normal limits. Right MCA M1 segment and trifurcation are patent without stenosis. Right MCA branches are within normal limits. Venous sinuses: Patent. Anatomic variants: Fetal type bilateral PCA origins. Late entry of the right vertebral artery into the cervical transverse foramen. Review of the MIP images confirms the above findings IMPRESSION: 1. Negative for large vessel occlusion. Minimal for age atherosclerosis in the head and neck. No arterial stenosis. 2. Subtle beaded appearance of the cervical ICAs, which are mildly ectatic.  Cannot exclude mild Fibromuscular Dysplasia (FMD). 3. C5 through ACDF with solid arthrodesis. Possible developing ankylosis of the C4-C5 adjacent segment. These results were communicated to Dr. Janett Medin at 12:54 pm on 12/25/2023 by text page via the [facility] messaging system. Electronically Signed   By: Marlise Simpers M.D.   On: 12/25/2023 12:55   CT HEAD CODE STROKE WO CONTRAST Result Date: 12/25/2023 CLINICAL DATA:  Code stroke. 68 year old female neurologic deficit. Blurred vision, left eye droop and headache onset 1015 hours. EXAM: CT HEAD WITHOUT CONTRAST TECHNIQUE: Contiguous axial images were obtained from the base of the skull through the vertex without intravenous contrast. RADIATION DOSE REDUCTION: This exam was performed according to the departmental dose-optimization program which includes automated exposure control, adjustment of the mA and/or kV according to patient size and/or use of iterative reconstruction technique. COMPARISON:  Brain MRI 04/19/2009. FINDINGS: Brain: Cerebral volume remains normal for age. No midline shift, ventriculomegaly, mass effect, evidence of mass lesion, intracranial hemorrhage or evidence of cortically based acute infarction. Mild for age scattered  small white matter hypodensity, most apparent in the left anterior corona radiata. Otherwise maintained gray-white differentiation. No cortical encephalomalacia identified. Suprasellar cistern appears normal. Vascular: Calcified atherosclerosis at the skull base. No suspicious intracranial vascular hyperdensity. Skull: Intact, negative. Sinuses/Orbits: Visualized paranasal sinuses and mastoids are stable and well aerated. Other: Visualized orbits and scalp soft tissues are within normal limits. No gaze deviation. ASPECTS [facility] Stroke Program Early CT Score) Total score (0-10 with 10 being normal): 10 IMPRESSION: 1. No acute cortically based infarct or acute intracranial hemorrhage identified. ASPECTS 10. 2. Mild for age white matter  changes most commonly due to small vessel disease. These results were communicated to Dr. Janett Medin at 12:38 pm on 12/25/2023 by text page via the [facility] messaging system. Electronically Signed   By: Marlise Simpers M.D.   On: 12/25/2023 12:38    Procedures .Critical Care  Performed by: Auston Blush, MD Authorized by: Auston Blush, MD   Critical care provider statement:    Critical care time (minutes):  30   Critical care end time:  12/25/2023 1:44 PM   Critical care time was exclusive of:  Separately billable procedures and treating other patients and teaching time   Critical care was necessary to treat or prevent imminent or life-threatening deterioration of the following conditions:  CNS failure or compromise   Critical care was time spent personally by me on the following activities:  Development of treatment plan with patient or surrogate, discussions with consultants, evaluation of patient's response to treatment, examination of patient, ordering and review of laboratory studies, ordering and review of radiographic studies, ordering and performing treatments and interventions, pulse oximetry, re-evaluation of patient's condition and review of old charts     Medications Ordered in ED Medications  iohexol (OMNIPAQUE) 350 MG/ML injection 75 mL (75 mLs Intravenous Contrast Given 12/25/23 1240)    ED Course/ Medical Decision Making/ A&P                                 Medical Decision Making  68 year old female with today presents with some lightheadedness and diplopia.  Patient evaluated as code stroke and had CT and MRI.  MRI shows some scattered subcortical T2 hyperintensity no evidence of stroke. Differential diagnosis includes but is not limited to multiple sclerosis, vasculitis, stroke, migraine, other inflammatory process.  With workup here she is cleared from stroke.  Myasthenia gravis and MS and vasculitis continue in differential.  Neurology has cleared.  Patient is given information regarding  return regarding any worsening symptoms that would indicate these were processes which were progressing. Patient did report some dark vision in the center which has since report solved. She has no visual field deficits Patient care was discussed with neurology and she is cleared from neurology for further outpatient workup.  It was evaluated for other etiologies of lightheadedness including having labs, EKG, electrolytes obtained.  No evidence of hypotension with normal EKG, normal vital signs here, normal hemoglobin. No evidence of abnormality from hyper or hypoglycemia with normal glucose and electrolytes and here in ED.  I spoke with Dr. Seward Dao on-call for Dr. Keturah Peer for ophthalmology.  He advises patient can be seen in follow-up in office. Both neurology and ophthalmology have cleared patient for outpatient workup. Patient has been stable here.  She is advised of return precautions and need for follow-up.  She is given referral to neurology outpatient.        Final Clinical  Impression(s) / ED Diagnoses Final diagnoses:  Diplopia  Ptosis of left eyelid    Rx / DC Orders ED Discharge Orders     None         Auston Blush, MD 12/25/23 1350

## 2023-12-25 NOTE — ED Triage Notes (Signed)
 To ED -- c/o blurry vision and left eye drooping starting at 1015 this am-- still has a headache-- left eye is droopy compared to right, palpitations  also -- has a heart monitor on to r/o AFib--  Equal hand grips, no drift in arms. Has had migraines- but her aura is flashing lights--

## 2023-12-25 NOTE — ED Provider Triage Note (Signed)
 Emergency Medicine Provider Triage Evaluation Note  Cheyenne Hanson , a 68 y.o. female  was evaluated in triage.  Pt complains of left-sided visual deficit, drooping of left eyelid.  Patient reports waking up this morning in usual state of health, reports that she was making some intervals when she noticed that she had blurred vision to her left eye primarily.  Reports that she still has blurred vision at this time.  States that she then noticed that her left eyelid was drooping and she felt a "pressure" in her head that she describes as a headache.  She denies neck pain, one-sided weakness or numbness.  Denies any word finding difficulty, slurred speech.  Reports that she recently had a heart monitor placed because her PCP is concerned that she might have A-fib.  Denies being on blood thinners.  Denies history of CVA.  Review of Systems  Positive:  Negative:   Physical Exam  BP 135/72 (BP Location: Right Arm)   Pulse 79   Temp 97.7 F (36.5 C)   Resp 17   Ht 5\' 8"  (1.727 m)   Wt 70.3 kg   SpO2 100%   BMI 23.57 kg/m  Gen:   Awake, no distress   Resp:  Normal effort  MSK:   Moves extremities without difficulty  Other:  Reassuring neurological examination.  Patient left eyelid is drooping.  She is able to raise both eyebrows equally.  She tracks across the midline.  She has equal strength upper extremity bilaterally.  Equal strength bilateral lower extremities.  Medical Decision Making  Medically screening exam initiated at 12:19 PM.  Appropriate orders placed.  Cheyenne Hanson was informed that the remainder of the evaluation will be completed by another provider, this initial triage assessment does not replace that evaluation, and the importance of remaining in the ED until their evaluation is complete.     Adel Aden, PA-C 12/25/23 1220

## 2023-12-26 ENCOUNTER — Encounter: Payer: Self-pay | Admitting: Neurology

## 2023-12-26 DIAGNOSIS — H2513 Age-related nuclear cataract, bilateral: Secondary | ICD-10-CM | POA: Diagnosis not present

## 2023-12-26 DIAGNOSIS — H532 Diplopia: Secondary | ICD-10-CM | POA: Diagnosis not present

## 2023-12-26 DIAGNOSIS — H40012 Open angle with borderline findings, low risk, left eye: Secondary | ICD-10-CM | POA: Diagnosis not present

## 2023-12-26 DIAGNOSIS — H1045 Other chronic allergic conjunctivitis: Secondary | ICD-10-CM | POA: Diagnosis not present

## 2023-12-27 ENCOUNTER — Ambulatory Visit (INDEPENDENT_AMBULATORY_CARE_PROVIDER_SITE_OTHER): Admitting: Family Medicine

## 2023-12-27 VITALS — BP 128/62 | HR 85 | Temp 98.2°F | Ht 68.0 in | Wt 157.0 lb

## 2023-12-27 DIAGNOSIS — H4902 Third [oculomotor] nerve palsy, left eye: Secondary | ICD-10-CM

## 2023-12-27 MED ORDER — PREDNISONE 20 MG PO TABS
ORAL_TABLET | ORAL | 0 refills | Status: DC
Start: 2023-12-27 — End: 2024-01-26

## 2023-12-27 NOTE — Progress Notes (Signed)
 Subjective:    Patient ID: Cheyenne Hanson, female    DOB: 06/30/1956, 68 y.o.   MRN: [id number]  HPI Patient awoke May 4 with ptosis of the left upper eyelid.  She also started developing diplopia in the left eye.  She would see double unless she looked to the left.  Her left eye was constantly turned to the left side.  She did not have a reactive pupil.  Went to the emergency room.  MRI revealed no evidence of stroke.  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  Since that time, patient has developed a rash on her torso.  It is an erythematous hive-like rash.  She also has noticed numbness and tingling in both arms.  She denies any further episodes of diplopia   Past Medical History:  Diagnosis Date   Allergy    [employer]reesia 11/09/2020   Anemia    Anxiety    uses klonopin  sparingly (30 lasts a year)   Basal cell carcinoma    "face, back, chest" (07/21/2017)   CAP (community acquired pneumonia) 07/20/2017   GERD (gastroesophageal reflux disease)    Headache    "maybe weekly; more w/seasonal allergies" (07/21/2017)   Heart murmur    [facility]reesia 11/09/2020   Hyperlipidemia    Mitral valve prolapse    OSA on CPAP    cpap since 2008   Pneumonia 1990s X 1   Seasonal allergies    Past Surgical History:  Procedure Laterality Date   ANTERIOR CERVICAL DECOMP/DISCECTOMY FUSION  ?2010    Dr. Rochelle Chu   ARTHROSCOPIC REPAIR ACL Left 1997   BACK SURGERY     BASAL CELL CARCINOMA EXCISION     "face, back, chest" (07/21/2017)   MELANOMA EXCISION  10/2019   on chest   Current Outpatient Medications on File Prior to Visit  Medication Sig Dispense Refill   azelastine  (ASTELIN ) 0.1 % nasal spray PLACE 2 SPRAYS INTO BOTH NOSTRILS 2 (TWO) TIMES DAILY. 90 mL 3   calcium carbonate (OSCAL) 1500 (600 Ca) MG TABS tablet Take by mouth 2 (two) times daily with a meal.     cetirizine (ZYRTEC) 10 MG tablet Take 10 mg by mouth daily.     cholecalciferol  (VITAMIN D3) 25 MCG (1000 UNIT) tablet Take 1,000 Units by mouth daily.     clonazePAM  (KLONOPIN ) 0.5 MG tablet Take 1 tablet (0.5 mg total) by mouth at bedtime as needed for anxiety. 30 tablet 0   ezetimibe  (ZETIA ) 10 MG tablet Take 1 tablet (10 mg total) by mouth daily. 90 tablet 3   guaiFENesin  (MUCINEX ) 600 MG 12 hr tablet Take by mouth 2 (two) times daily. (Patient not taking: Reported on 12/15/2023)     methocarbamol  (ROBAXIN ) 500 MG tablet Take 1 tablet (500 mg total) by mouth every 6 (six) hours as needed for muscle spasms. 30 tablet 0   No current facility-administered medications on file prior to visit.   Allergies  Allergen Reactions   Latex Other (See Comments)    Blisters if she wears the gloves   Statins     Muscle aches   Social History   Socioeconomic History   Marital status: Married    Spouse name: Not on file   Number of children: Not on file   Years of education: Not on file   Highest education level: Associate degree: occupational, Scientist, product/process development, or vocational program  Occupational History   Not on file  Tobacco Use  Smoking status: Never   Smokeless tobacco: Never  Vaping Use   Vaping status: Never Used  Substance and Sexual Activity   Alcohol use: Yes    Alcohol/week: 2.0 standard drinks of alcohol    Types: 2 Glasses of wine per week   Drug use: No   Sexual activity: Not Currently  Other Topics Concern   Not on file  Social History Narrative   Not on file   Social Drivers of Health   Financial Resource Strain: Low Risk  (08/26/2023)   Overall Financial Resource Strain (CARDIA)    Difficulty of Paying Living Expenses: Not hard at all  Food Insecurity: No Food Insecurity (08/26/2023)   Hunger Vital Sign    Worried About Running Out of Food in the Last Year: Never true    Ran Out of Food in the Last Year: Never true  Transportation Needs: No Transportation Needs (08/26/2023)   PRAPARE - Administrator, Civil Service (Medical): No    Lack of  Transportation (Non-Medical): No  Physical Activity: Insufficiently Active (08/26/2023)   Exercise Vital Sign    Days of Exercise per Week: 2 days    Minutes of Exercise per Session: 20 min  Stress: No Stress Concern Present (08/26/2023)   [employer] of Occupational Health - Occupational Stress Questionnaire    Feeling of Stress : Only a little  Social Connections: Unknown (08/26/2023)   Social Connection and Isolation Panel [NHANES]    Frequency of Communication with Friends and Family: More than three times a week    Frequency of Social Gatherings with Friends and Family: Once a week    Attends Religious Services: Patient declined    Database administrator or Organizations: No    Attends Banker Meetings: Never    Marital Status: Married  Catering manager Violence: Not At Risk (04/07/2023)   Humiliation, Afraid, Rape, and Kick questionnaire    Fear of Current or Ex-Partner: No    Emotionally Abused: No    Physically Abused: No    Sexually Abused: No   Family History  Problem Relation Age of Onset   Diabetes Mother    Cancer Father        lung   Stroke Sister    Hyperlipidemia Sister    Diabetes Sister    Heart disease Brother    Hyperlipidemia Brother    Breast cancer Cousin    Diabetes Cousin    Diabetes Maternal Grandmother    Amblyopia Neg Hx    Blindness Neg Hx    Cataracts Neg Hx    Glaucoma Neg Hx    Macular degeneration Neg Hx    Retinal detachment Neg Hx    Strabismus Neg Hx    Retinitis pigmentosa Neg Hx    Dad also committed suicide  Review of Systems  All other systems reviewed and are negative.      Objective:   Physical Exam Vitals reviewed.  Constitutional:      General: She is not in acute distress.    Appearance: She is well-developed. She is not diaphoretic.  HENT:     Head: Normocephalic and atraumatic.     Right Ear: External ear normal.     Left Ear: External ear normal.     Nose: Nose normal.     Mouth/Throat:      Pharynx: No oropharyngeal exudate.  Eyes:     General: No scleral icterus.       Right eye: No  discharge.        Left eye: No discharge.     Conjunctiva/sclera: Conjunctivae normal.     Pupils: Pupils are equal, round, and reactive to light.  Neck:     Thyroid: No thyromegaly.     Vascular: No JVD.     Trachea: No tracheal deviation.  Cardiovascular:     Rate and Rhythm: Normal rate and regular rhythm.     Heart sounds: Normal heart sounds. No murmur heard.    No friction rub. No gallop.  Pulmonary:     Effort: Pulmonary effort is normal. No respiratory distress.     Breath sounds: Normal breath sounds. No stridor. No wheezing or rales.  Chest:     Chest wall: No tenderness.  Abdominal:     General: Bowel sounds are normal. There is no distension.     Palpations: Abdomen is soft. There is no mass.     Tenderness: There is no abdominal tenderness. There is no guarding or rebound.  Musculoskeletal:        General: No tenderness or deformity. Normal range of motion.     Cervical back: Normal range of motion and neck supple.  Lymphadenopathy:     Cervical: No cervical adenopathy.  Skin:    General: Skin is warm.     Coloration: Skin is not pale.     Findings: No erythema or rash.  Neurological:     Mental Status: She is alert and oriented to person, place, and time.     Cranial Nerves: No cranial nerve deficit.     Motor: No abnormal muscle tone.     Coordination: Coordination normal.     Deep Tendon Reflexes: Reflexes are normal and symmetric.  Psychiatric:        Behavior: Behavior normal.        Thought Content: Thought content normal.        Judgment: Judgment normal.           Assessment & Plan:  Total left oculomotor nerve palsy - Plan: Acetylcholine Receptor, Binding, MuSK Antibodies, ANA, CBC with Differential/Platelet, COMPLETE METABOLIC PANEL WITHOUT GFR, Sedimentation rate Differential diagnosis includes microvascular ischemia transient 3rd nerve palsy  versus myasthenia gravis.  Check acetylcholine receptor binding antibodies and muscarinic receptor binding antibodies.  Given the hive-like rash, I am concerned about other autoimmune diseases so we will check an ANA, sed rate, CBC and CMP.  Use Benadryl 5 mg every 6 hours as needed for rash.  Rash once again prednisone  taper pack.  We discussed performing the icepack test if ptosis recurs again.  Consult neurology if recurrent

## 2023-12-29 ENCOUNTER — Inpatient Hospital Stay: Admitting: Family Medicine

## 2023-12-29 LAB — CBC WITH DIFFERENTIAL/PLATELET
Absolute Lymphocytes: 1311 {cells}/uL (ref 850–3900)
Absolute Monocytes: 437 {cells}/uL (ref 200–950)
Basophils Absolute: 29 {cells}/uL (ref 0–200)
Basophils Relative: 0.3 %
Eosinophils Absolute: 219 {cells}/uL (ref 15–500)
Eosinophils Relative: 2.3 %
HCT: 43.3 % (ref 35.0–45.0)
Hemoglobin: 14 g/dL (ref 11.7–15.5)
MCH: 30.2 pg (ref 27.0–33.0)
MCHC: 32.3 g/dL (ref 32.0–36.0)
MCV: 93.3 fL (ref 80.0–100.0)
MPV: 10 fL (ref 7.5–12.5)
Monocytes Relative: 4.6 %
Neutro Abs: 7505 {cells}/uL (ref 1500–7800)
Neutrophils Relative %: 79 %
Platelets: 260 10*3/uL (ref 140–400)
RBC: 4.64 10*6/uL (ref 3.80–5.10)
RDW: 12.6 % (ref 11.0–15.0)
Total Lymphocyte: 13.8 %
WBC: 9.5 10*3/uL (ref 3.8–10.8)

## 2023-12-29 LAB — COMPLETE METABOLIC PANEL WITHOUT GFR
AG Ratio: 2.1 (calc) (ref 1.0–2.5)
ALT: 10 U/L (ref 6–29)
AST: 15 U/L (ref 10–35)
Albumin: 4.5 g/dL (ref 3.6–5.1)
Alkaline phosphatase (APISO): 40 U/L (ref 37–153)
BUN: 18 mg/dL (ref 7–25)
CO2: 29 mmol/L (ref 20–32)
Calcium: 9.6 mg/dL (ref 8.6–10.4)
Chloride: 101 mmol/L (ref 98–110)
Creat: 0.94 mg/dL (ref 0.50–1.05)
Globulin: 2.1 g/dL (ref 1.9–3.7)
Glucose, Bld: 90 mg/dL (ref 65–99)
Potassium: 4.8 mmol/L (ref 3.5–5.3)
Sodium: 138 mmol/L (ref 135–146)
Total Bilirubin: 0.5 mg/dL (ref 0.2–1.2)
Total Protein: 6.6 g/dL (ref 6.1–8.1)

## 2023-12-29 LAB — SEDIMENTATION RATE: Sed Rate: 2 mm/h (ref 0–30)

## 2023-12-29 LAB — ANA: Anti Nuclear Antibody (ANA): NEGATIVE

## 2024-01-02 LAB — MUSK ANTIBODIES

## 2024-01-02 LAB — ACETYLCHOLINE RECEPTOR, BINDING: A CHR BINDING ABS: <0.3 nmol/L

## 2024-01-05 ENCOUNTER — Other Ambulatory Visit: Payer: Self-pay | Admitting: Family Medicine

## 2024-01-05 DIAGNOSIS — G7000 Myasthenia gravis without (acute) exacerbation: Secondary | ICD-10-CM

## 2024-01-10 ENCOUNTER — Encounter: Payer: Self-pay | Admitting: Family Medicine

## 2024-01-13 ENCOUNTER — Ambulatory Visit: Admitting: Family Medicine

## 2024-01-16 DIAGNOSIS — R002 Palpitations: Secondary | ICD-10-CM | POA: Diagnosis not present

## 2024-01-21 DIAGNOSIS — R002 Palpitations: Secondary | ICD-10-CM

## 2024-01-23 ENCOUNTER — Ambulatory Visit: Admitting: Family Medicine

## 2024-01-23 ENCOUNTER — Ambulatory Visit: Payer: Self-pay | Admitting: Family Medicine

## 2024-01-26 ENCOUNTER — Ambulatory Visit (INDEPENDENT_AMBULATORY_CARE_PROVIDER_SITE_OTHER): Admitting: Family Medicine

## 2024-01-26 VITALS — BP 116/62 | HR 72 | Temp 98.6°F | Ht 68.0 in | Wt 156.0 lb

## 2024-01-26 DIAGNOSIS — G4733 Obstructive sleep apnea (adult) (pediatric): Secondary | ICD-10-CM | POA: Diagnosis not present

## 2024-01-26 NOTE — Progress Notes (Signed)
 Subjective:    Patient ID: Cheyenne Hanson, female    DOB: April 10, 1956, 68 y.o.   MRN: [id number]  HPI Patient had a sleep study in 2010 confirming obstructive sleep apnea.  Apnea-hypopnea index was 57.9 when supine.  Her total apnea-hypopnea index was 15.5.  Therefore the patient is currently on CPAP therapy.  Patient states that she wears her CPAP every night 100% of the time.  She averages between 7 and 9 hours of usage per night.  She states there goes out or she is unable to wear the machine she feels like "she was hit by a truck" the next day.  She feels much better after she wears the machine at night.  Her previous CPAP machine is from 2008.  It has a long past the lifespan of its motor and she is concerned that it is going to break   Past Medical History:  Diagnosis Date   Allergy    [facility]reesia 11/09/2020   Anemia    Anxiety    uses klonopin  sparingly (30 lasts a year)   Basal cell carcinoma    "face, back, chest" (07/21/2017)   CAP (community acquired pneumonia) 07/20/2017   GERD (gastroesophageal reflux disease)    Headache    "maybe weekly; more w/seasonal allergies" (07/21/2017)   Heart murmur    [facility]ia 11/09/2020   Hyperlipidemia    Mitral valve prolapse    OSA on CPAP    cpap since 2008   Pneumonia 1990s X 1   Seasonal allergies    Past Surgical History:  Procedure Laterality Date   ANTERIOR CERVICAL DECOMP/DISCECTOMY FUSION  ?2010    Dr. Rochelle Chu   ARTHROSCOPIC REPAIR ACL Left 1997   BACK SURGERY     BASAL CELL CARCINOMA EXCISION     "face, back, chest" (07/21/2017)   MELANOMA EXCISION  10/2019   on chest   Current Outpatient Medications on File Prior to Visit  Medication Sig Dispense Refill   azelastine  (ASTELIN ) 0.1 % nasal spray PLACE 2 SPRAYS INTO BOTH NOSTRILS 2 (TWO) TIMES DAILY. 90 mL 3   calcium carbonate (OSCAL) 1500 (600 Ca) MG TABS tablet Take by mouth 2 (two) times daily with a meal.     cetirizine (ZYRTEC) 10 MG tablet Take 10 mg by mouth daily.      cholecalciferol (VITAMIN D3) 25 MCG (1000 UNIT) tablet Take 1,000 Units by mouth daily.     clonazePAM  (KLONOPIN ) 0.5 MG tablet Take 1 tablet (0.5 mg total) by mouth at bedtime as needed for anxiety. 30 tablet 0   ezetimibe  (ZETIA ) 10 MG tablet Take 1 tablet (10 mg total) by mouth daily. 90 tablet 3   guaiFENesin  (MUCINEX ) 600 MG 12 hr tablet Take by mouth 2 (two) times daily. (Patient not taking: Reported on 12/15/2023)     methocarbamol  (ROBAXIN ) 500 MG tablet Take 1 tablet (500 mg total) by mouth every 6 (six) hours as needed for muscle spasms. 30 tablet 0   predniSONE  (DELTASONE ) 20 MG tablet 3 tabs poqday 1-2, 2 tabs poqday 3-4, 1 tab poqday 5-6 12 tablet 0   No current facility-administered medications on file prior to visit.   Allergies  Allergen Reactions   Latex Other (See Comments)    Blisters if she wears the gloves   Statins     Muscle aches   Social History   Socioeconomic History   Marital status: Married    Spouse name: Not on file   Number of children: Not on  file   Years of education: Not on file   Highest education level: Associate degree: occupational, Scientist, product/process development, or vocational program  Occupational History   Not on file  Tobacco Use   Smoking status: Never   Smokeless tobacco: Never  Vaping Use   Vaping status: Never Used  Substance and Sexual Activity   Alcohol use: Yes    Alcohol/week: 2.0 standard drinks of alcohol    Types: 2 Glasses of wine per week   Drug use: No   Sexual activity: Not Currently  Other Topics Concern   Not on file  Social History Narrative   Not on file   Social Drivers of Health   Financial Resource Strain: Low Risk  (08/26/2023)   Overall Financial Resource Strain (CARDIA)    Difficulty of Paying Living Expenses: Not hard at all  Food Insecurity: No Food Insecurity (08/26/2023)   Hunger Vital Sign    Worried About Running Out of Food in the Last Year: Never true    Ran Out of Food in the Last Year: Never true   Transportation Needs: No Transportation Needs (08/26/2023)   PRAPARE - Administrator, Civil Service (Medical): No    Lack of Transportation (Non-Medical): No  Physical Activity: Insufficiently Active (08/26/2023)   Exercise Vital Sign    Days of Exercise per Week: 2 days    Minutes of Exercise per Session: 20 min  Stress: No Stress Concern Present (08/26/2023)   [employer] of Occupational Health - Occupational Stress Questionnaire    Feeling of Stress : Only a little  Social Connections: Unknown (08/26/2023)   Social Connection and Isolation Panel [NHANES]    Frequency of Communication with Friends and Family: More than three times a week    Frequency of Social Gatherings with Friends and Family: Once a week    Attends Religious Services: Patient declined    Database administrator or Organizations: No    Attends Banker Meetings: Never    Marital Status: Married  Catering manager Violence: Not At Risk (04/07/2023)   Humiliation, Afraid, Rape, and Kick questionnaire    Fear of Current or Ex-Partner: No    Emotionally Abused: No    Physically Abused: No    Sexually Abused: No   Family History  Problem Relation Age of Onset   Diabetes Mother    Cancer Father        lung   Stroke Sister    Hyperlipidemia Sister    Diabetes Sister    Heart disease Brother    Hyperlipidemia Brother    Breast cancer Cousin    Diabetes Cousin    Diabetes Maternal Grandmother    Amblyopia Neg Hx    Blindness Neg Hx    Cataracts Neg Hx    Glaucoma Neg Hx    Macular degeneration Neg Hx    Retinal detachment Neg Hx    Strabismus Neg Hx    Retinitis pigmentosa Neg Hx    Dad also committed suicide  Review of Systems  All other systems reviewed and are negative.      Objective:   Physical Exam Vitals reviewed.  Constitutional:      General: She is not in acute distress.    Appearance: She is well-developed. She is not diaphoretic.  HENT:     Head: Normocephalic  and atraumatic.     Right Ear: External ear normal.     Left Ear: External ear normal.     Nose:  Nose normal.     Mouth/Throat:     Pharynx: No oropharyngeal exudate.  Eyes:     General: No scleral icterus.       Right eye: No discharge.        Left eye: No discharge.     Conjunctiva/sclera: Conjunctivae normal.     Pupils: Pupils are equal, round, and reactive to light.  Neck:     Thyroid: No thyromegaly.     Vascular: No JVD.     Trachea: No tracheal deviation.  Cardiovascular:     Rate and Rhythm: Normal rate and regular rhythm.     Heart sounds: Normal heart sounds. No murmur heard.    No friction rub. No gallop.  Pulmonary:     Effort: Pulmonary effort is normal. No respiratory distress.     Breath sounds: Normal breath sounds. No stridor. No wheezing or rales.  Chest:     Chest wall: No tenderness.  Abdominal:     General: Bowel sounds are normal. There is no distension.     Palpations: Abdomen is soft. There is no mass.     Tenderness: There is no abdominal tenderness. There is no guarding or rebound.  Musculoskeletal:        General: No tenderness or deformity. Normal range of motion.     Cervical back: Normal range of motion and neck supple.  Lymphadenopathy:     Cervical: No cervical adenopathy.  Skin:    General: Skin is warm.     Coloration: Skin is not pale.     Findings: No erythema or rash.  Neurological:     Mental Status: She is alert and oriented to person, place, and time.     Cranial Nerves: No cranial nerve deficit.     Motor: No abnormal muscle tone.     Coordination: Coordination normal.     Deep Tendon Reflexes: Reflexes are normal and symmetric.  Psychiatric:        Behavior: Behavior normal.        Thought Content: Thought content normal.        Judgment: Judgment normal.           Assessment & Plan:  Obstructive sleep apnea syndrome Patient has obstructive sleep apnea.  She wears the CPAP every night for 7 to 9 hours.  She feels much  better after she wears the machine.  She definitely benefits from it.  I will send this information to her DME so that hopefully she can get a new machine since her current machine is 67 years old

## 2024-02-16 DIAGNOSIS — G4733 Obstructive sleep apnea (adult) (pediatric): Secondary | ICD-10-CM | POA: Diagnosis not present

## 2024-02-21 DIAGNOSIS — H40012 Open angle with borderline findings, low risk, left eye: Secondary | ICD-10-CM | POA: Diagnosis not present

## 2024-02-21 DIAGNOSIS — H1045 Other chronic allergic conjunctivitis: Secondary | ICD-10-CM | POA: Diagnosis not present

## 2024-02-21 DIAGNOSIS — H532 Diplopia: Secondary | ICD-10-CM | POA: Diagnosis not present

## 2024-02-21 DIAGNOSIS — H2513 Age-related nuclear cataract, bilateral: Secondary | ICD-10-CM | POA: Diagnosis not present

## 2024-02-29 ENCOUNTER — Ambulatory Visit: Admitting: Neurology

## 2024-03-01 DIAGNOSIS — H02402 Unspecified ptosis of left eyelid: Secondary | ICD-10-CM | POA: Diagnosis not present

## 2024-03-01 DIAGNOSIS — R5383 Other fatigue: Secondary | ICD-10-CM | POA: Diagnosis not present

## 2024-03-01 DIAGNOSIS — G7000 Myasthenia gravis without (acute) exacerbation: Secondary | ICD-10-CM | POA: Diagnosis not present

## 2024-03-17 DIAGNOSIS — G4733 Obstructive sleep apnea (adult) (pediatric): Secondary | ICD-10-CM | POA: Diagnosis not present

## 2024-03-29 ENCOUNTER — Ambulatory Visit: Admitting: *Deleted

## 2024-03-29 VITALS — Ht 68.0 in | Wt 158.0 lb

## 2024-03-29 DIAGNOSIS — Z0000 Encounter for general adult medical examination without abnormal findings: Secondary | ICD-10-CM

## 2024-03-29 NOTE — Patient Instructions (Signed)
 Cheyenne Hanson , Thank you for taking time to come for your Medicare Wellness Visit. I appreciate your ongoing commitment to your health goals. Please review the following plan we discussed and let me know if I can assist you in the future.   Screening recommendations/referrals: Colonoscopy: up to date Mammogram: up to date Bone Density: up to date Recommended yearly ophthalmology/optometry visit for glaucoma screening and checkup Recommended yearly dental visit for hygiene and checkup  Vaccinations: Influenza vaccine: Education provided Pneumococcal vaccine: up to date Tdap vaccine: up to date Shingles vaccine: up to date       Preventive Care 65 Years and Older, Female Preventive care refers to lifestyle choices and visits with your health care provider that can promote health and wellness. What does preventive care include? A yearly physical exam. This is also called an annual well check. Dental exams once or twice a year. Routine eye exams. Ask your health care provider how often you should have your eyes checked. Personal lifestyle choices, including: Daily care of your teeth and gums. Regular physical activity. Eating a healthy diet. Avoiding tobacco and drug use. Limiting alcohol use. Practicing safe sex. Taking low-dose aspirin  every day. Taking vitamin and mineral supplements as recommended by your health care provider. What happens during an annual well check? The services and screenings done by your health care provider during your annual well check will depend on your age, overall health, lifestyle risk factors, and family history of disease. Counseling  Your health care provider may ask you questions about your: Alcohol use. Tobacco use. Drug use. Emotional well-being. Home and relationship well-being. Sexual activity. Eating habits. History of falls. Memory and ability to understand (cognition). Work and work Astronomer. Reproductive health. Screening  You  may have the following tests or measurements: Height, weight, and BMI. Blood pressure. Lipid and cholesterol levels. These may be checked every 5 years, or more frequently if you are over 17 years old. Skin check. Lung cancer screening. You may have this screening every year starting at age 108 if you have a 30-pack-year history of smoking and currently smoke or have quit within the past 15 years. Fecal occult blood test (FOBT) of the stool. You may have this test every year starting at age 26. Flexible sigmoidoscopy or colonoscopy. You may have a sigmoidoscopy every 5 years or a colonoscopy every 10 years starting at age 72. Hepatitis C blood test. Hepatitis B blood test. Sexually transmitted disease (STD) testing. Diabetes screening. This is done by checking your blood sugar (glucose) after you have not eaten for a while (fasting). You may have this done every 1-3 years. Bone density scan. This is done to screen for osteoporosis. You may have this done starting at age 83. Mammogram. This may be done every 1-2 years. Talk to your health care provider about how often you should have regular mammograms. Talk with your health care provider about your test results, treatment options, and if necessary, the need for more tests. Vaccines  Your health care provider may recommend certain vaccines, such as: Influenza vaccine. This is recommended every year. Tetanus, diphtheria, and acellular pertussis (Tdap, Td) vaccine. You may need a Td booster every 10 years. Zoster vaccine. You may need this after age 78. Pneumococcal 13-valent conjugate (PCV13) vaccine. One dose is recommended after age 45. Pneumococcal polysaccharide (PPSV23) vaccine. One dose is recommended after age 51. Talk to your health care provider about which screenings and vaccines you need and how often you need them. This  information is not intended to replace advice given to you by your health care provider. Make sure you discuss any  questions you have with your health care provider. Document Released: 09/05/2015 Document Revised: 04/28/2016 Document Reviewed: 06/10/2015 Elsevier Interactive Patient Education  2017 [employer].  Fall Prevention in the Home Falls can cause injuries. They can happen to people of all ages. There are many things you can do to make your home safe and to help prevent falls. What can I do on the outside of my home? Regularly fix the edges of walkways and driveways and fix any cracks. Remove anything that might make you trip as you walk through a door, such as a raised step or threshold. Trim any bushes or trees on the path to your home. Use bright outdoor lighting. Clear any walking paths of anything that might make someone trip, such as rocks or tools. Regularly check to see if handrails are loose or broken. Make sure that both sides of any steps have handrails. Any raised decks and porches should have guardrails on the edges. Have any leaves, snow, or ice cleared regularly. Use sand or salt on walking paths during winter. Clean up any spills in your garage right away. This includes oil or grease spills. What can I do in the bathroom? Use night lights. Install grab bars by the toilet and in the tub and shower. Do not use towel bars as grab bars. Use non-skid mats or decals in the tub or shower. If you need to sit down in the shower, use a plastic, non-slip stool. Keep the floor dry. Clean up any water that spills on the floor as soon as it happens. Remove soap buildup in the tub or shower regularly. Attach bath mats securely with double-sided non-slip rug tape. Do not have throw rugs and other things on the floor that can make you trip. What can I do in the bedroom? Use night lights. Make sure that you have a light by your bed that is easy to reach. Do not use any sheets or blankets that are too big for your bed. They should not hang down onto the floor. Have a firm chair that has side  arms. You can use this for support while you get dressed. Do not have throw rugs and other things on the floor that can make you trip. What can I do in the kitchen? Clean up any spills right away. Avoid walking on wet floors. Keep items that you use a lot in easy-to-reach places. If you need to reach something above you, use a strong step stool that has a grab bar. Keep electrical cords out of the way. Do not use floor polish or wax that makes floors slippery. If you must use wax, use non-skid floor wax. Do not have throw rugs and other things on the floor that can make you trip. What can I do with my stairs? Do not leave any items on the stairs. Make sure that there are handrails on both sides of the stairs and use them. Fix handrails that are broken or loose. Make sure that handrails are as long as the stairways. Check any carpeting to make sure that it is firmly attached to the stairs. Fix any carpet that is loose or worn. Avoid having throw rugs at the top or bottom of the stairs. If you do have throw rugs, attach them to the floor with carpet tape. Make sure that you have a light switch at the top  of the stairs and the bottom of the stairs. If you do not have them, ask someone to add them for you. What else can I do to help prevent falls? Wear shoes that: Do not have high heels. Have rubber bottoms. Are comfortable and fit you well. Are closed at the toe. Do not wear sandals. If you use a stepladder: Make sure that it is fully opened. Do not climb a closed stepladder. Make sure that both sides of the stepladder are locked into place. Ask someone to hold it for you, if possible. Clearly mark and make sure that you can see: Any grab bars or handrails. First and last steps. Where the edge of each step is. Use tools that help you move around (mobility aids) if they are needed. These include: Canes. Walkers. Scooters. Crutches. Turn on the lights when you go into a dark area.  Replace any light bulbs as soon as they burn out. Set up your furniture so you have a clear path. Avoid moving your furniture around. If any of your floors are uneven, fix them. If there are any pets around you, be aware of where they are. Review your medicines with your doctor. Some medicines can make you feel dizzy. This can increase your chance of falling. Ask your doctor what other things that you can do to help prevent falls. This information is not intended to replace advice given to you by your health care provider. Make sure you discuss any questions you have with your health care provider. Document Released: 06/05/2009 Document Revised: 01/15/2016 Document Reviewed: 09/13/2014 Elsevier Interactive Patient Education  2017 [employer].

## 2024-03-29 NOTE — Progress Notes (Signed)
 Subjective:   Cheyenne Hanson is a 68 y.o. female who presents for Medicare Annual (Subsequent) preventive examination.  Visit Complete: Virtual I connected with  Barnie GORMAN Chol on 03/29/24 by a audio enabled telemedicine application and verified that I am speaking with the correct person using two identifiers.  Patient Location: Home  Provider Location: Home Office  I discussed the limitations of evaluation and management by telemedicine. The patient expressed understanding and agreed to proceed.  Vital Signs: Because this visit was a virtual/telehealth visit, some criteria may be missing or patient reported. Any vitals not documented were not able to be obtained and vitals that have been documented are patient reported.   Cardiac Risk Factors include: advanced age (>33men, >58 women)     Objective:    Today's Vitals   03/29/24 1412  Weight: 158 lb (71.7 kg)  Height: 5' 8 (1.727 m)   Body mass index is 24.02 kg/m.     03/29/2024    2:06 PM 12/25/2023   12:14 PM 04/07/2023    2:11 PM 08/07/2022   12:04 PM 07/21/2017   10:51 PM 07/20/2017    9:31 PM  Advanced Directives  Does Patient Have a Medical Advance Directive? Yes No No Yes Yes  No   Type of Advance Directive Healthcare Power of Attorney   Living will Living will   Does patient want to make changes to medical advance directive?     No - Patient declined    Copy of Healthcare Power of Attorney in Chart? No - copy requested       Would patient like information on creating a medical advance directive?   Yes (MAU/Ambulatory/Procedural Areas - Information given)        Data saved with a previous flowsheet row definition    Current Medications (verified) Outpatient Encounter Medications as of 03/29/2024  Medication Sig   aspirin  EC 81 MG tablet Take 81 mg by mouth daily. Swallow whole.   azelastine  (ASTELIN ) 0.1 % nasal spray PLACE 2 SPRAYS INTO BOTH NOSTRILS 2 (TWO) TIMES DAILY.   calcium carbonate (OSCAL) 1500 (600  Ca) MG TABS tablet Take by mouth 2 (two) times daily with a meal.   cetirizine (ZYRTEC) 10 MG tablet Take 10 mg by mouth daily.   cholecalciferol (VITAMIN D3) 25 MCG (1000 UNIT) tablet Take 1,000 Units by mouth daily.   clonazePAM  (KLONOPIN ) 0.5 MG tablet Take 1 tablet (0.5 mg total) by mouth at bedtime as needed for anxiety.   ezetimibe  (ZETIA ) 10 MG tablet Take 1 tablet (10 mg total) by mouth daily.   guaiFENesin  (MUCINEX ) 600 MG 12 hr tablet Take by mouth 2 (two) times daily.   methocarbamol  (ROBAXIN ) 500 MG tablet Take 1 tablet (500 mg total) by mouth every 6 (six) hours as needed for muscle spasms.   No facility-administered encounter medications on file as of 03/29/2024.    Allergies (verified) Latex and Statins   History: Past Medical History:  Diagnosis Date   Allergy    [employer]reesia 11/09/2020   Anemia    Anxiety    uses klonopin  sparingly (30 lasts a year)   Basal cell carcinoma    face, back, chest (07/21/2017)   CAP (community acquired pneumonia) 07/20/2017   GERD (gastroesophageal reflux disease)    Headache    maybe weekly; more w/seasonal allergies (07/21/2017)   Heart murmur    [employer]reesia 11/09/2020   Hyperlipidemia    Mitral valve prolapse    OSA on CPAP  cpap since 2008   Pneumonia 1990s X 1   Seasonal allergies    Past Surgical History:  Procedure Laterality Date   ANTERIOR CERVICAL DECOMP/DISCECTOMY FUSION  ?2010    Dr. Joshua   ARTHROSCOPIC REPAIR ACL Left 1997   BACK SURGERY     BASAL CELL CARCINOMA EXCISION     face, back, chest (07/21/2017)   MELANOMA EXCISION  10/2019   on chest   Family History  Problem Relation Age of Onset   Diabetes Mother    Cancer Father        lung   Stroke Sister    Hyperlipidemia Sister    Diabetes Sister    Heart disease Brother    Hyperlipidemia Brother    Breast cancer Cousin    Diabetes Cousin    Diabetes Maternal Grandmother    Amblyopia Neg Hx    Blindness Neg Hx    Cataracts Neg Hx    Glaucoma Neg  Hx    Macular degeneration Neg Hx    Retinal detachment Neg Hx    Strabismus Neg Hx    Retinitis pigmentosa Neg Hx    Social History   Socioeconomic History   Marital status: Married    Spouse name: Not on file   Number of children: Not on file   Years of education: Not on file   Highest education level: Associate degree: academic program  Occupational History   Not on file  Tobacco Use   Smoking status: Never   Smokeless tobacco: Never  Vaping Use   Vaping status: Never Used  Substance and Sexual Activity   Alcohol use: Yes    Alcohol/week: 2.0 standard drinks of alcohol    Types: 2 Glasses of wine per week   Drug use: No   Sexual activity: Not Currently  Other Topics Concern   Not on file  Social History Narrative   Not on file   Social Drivers of Health   Financial Resource Strain: Low Risk  (03/29/2024)   Overall Financial Resource Strain (CARDIA)    Difficulty of Paying Living Expenses: Not hard at all  Food Insecurity: No Food Insecurity (03/29/2024)   Hunger Vital Sign    Worried About Running Out of Food in the Last Year: Never true    Ran Out of Food in the Last Year: Never true  Transportation Needs: No Transportation Needs (03/29/2024)   PRAPARE - Administrator, Civil Service (Medical): No    Lack of Transportation (Non-Medical): No  Physical Activity: Inactive (03/29/2024)   Exercise Vital Sign    Days of Exercise per Week: 0 days    Minutes of Exercise per Session: 0 min  Stress: No Stress Concern Present (03/29/2024)   [employer] of Occupational Health - Occupational Stress Questionnaire    Feeling of Stress: Not at all  Social Connections: Moderately Isolated (03/29/2024)   Social Connection and Isolation Panel    Frequency of Communication with Friends and Family: More than three times a week    Frequency of Social Gatherings with Friends and Family: Twice a week    Attends Religious Services: Never    Database administrator or  Organizations: No    Attends Engineer, structural: Never    Marital Status: Married    Tobacco Counseling Counseling given: Not Answered   Clinical Intake:  Pre-visit preparation completed: Yes  Pain : No/denies pain     Diabetes: No  How often do you need to  have someone help you when you read instructions, pamphlets, or other written materials from your doctor or pharmacy?: 1 - Never  Interpreter Needed?: No  Information entered by :: Mliss Graff LPN   Activities of Daily Living    03/29/2024    2:12 PM 04/05/2023    4:45 PM  In your present state of health, do you have any difficulty performing the following activities:  Hearing? 0 0  Vision? 0 0  Difficulty concentrating or making decisions? 0 0  Walking or climbing stairs? 0 0  Dressing or bathing? 0 0  Doing errands, shopping? 0 0  Preparing Food and eating ? N N  Using the Toilet? N N  In the past six months, have you accidently leaked urine? N N  Do you have problems with loss of bowel control? N N  Managing your Medications? N N  Managing your Finances? N N  Housekeeping or managing your Housekeeping? N N    Patient Care Team: Duanne Butler DASEN, MD as PCP - General (Family Medicine)  Indicate any recent Medical Services you may have received from other than [facility] providers in the past year (date may be approximate).     Assessment:   This is a routine wellness examination for Cheyenne Hanson.  Hearing/Vision screen Hearing Screening - Comments:: No trouble hearing Vision Screening - Comments:: Up to date [facility]   Goals Addressed             This Visit's Progress    Patient Stated       Travel        Depression Screen    03/29/2024    2:11 PM 12/15/2023    8:30 AM 08/26/2023   10:53 AM 04/07/2023    2:05 PM 11/30/2022    8:26 AM 07/01/2022   11:03 AM 01/12/2022    9:26 AM  PHQ 2/9 Scores  PHQ - 2 Score 0 0 0 0 0 0 0  PHQ- 9 Score 1 3  0  2     Fall Risk    03/29/2024    2:06 PM  12/15/2023    8:30 AM 08/26/2023   10:53 AM 04/05/2023    4:45 PM 11/30/2022    8:26 AM  Fall Risk   Falls in the past year? 1 0 0 0 0  Number falls in past yr: 1 0 0 0 0  Injury with Fall? 0 0 0 0 0  Risk for fall due to :  No Fall Risks No Fall Risks No Fall Risks No Fall Risks  Follow up Falls evaluation completed;Education provided;Falls prevention discussed Falls prevention discussed;Falls evaluation completed Falls prevention discussed Falls prevention discussed;Education provided;Falls evaluation completed Falls prevention discussed    MEDICARE RISK AT HOME: Medicare Risk at Home Any stairs in or around the home?: Yes If so, are there any without handrails?: No Home free of loose throw rugs in walkways, pet beds, electrical cords, etc?: Yes Adequate lighting in your home to reduce risk of falls?: Yes Life alert?: No Use of a cane, walker or w/c?: No Grab bars in the bathroom?: Yes Shower chair or bench in shower?: Yes Elevated toilet seat or a handicapped toilet?: Yes  TIMED UP AND GO:  Was the test performed?  No    Cognitive Function:        03/29/2024    2:08 PM 04/07/2023    2:11 PM  6CIT Screen  What Year? 0 points 0 points  What month? 0 points  0 points  What time? 0 points 0 points  Count back from 20 0 points 0 points  Months in reverse 0 points 0 points  Repeat phrase 0 points 0 points  Total Score 0 points 0 points    Immunizations Immunization History  Administered Date(s) Administered   Fluad Quad(high Dose 65+) 06/13/2022   Influenza,inj,Quad PF,6+ Mos 04/26/2019   Influenza-Unspecified 05/19/2017   PFIZER(Purple Top)SARS-COV-2 Vaccination 10/07/2019, 10/31/2019, 06/23/2020   PNEUMOCOCCAL CONJUGATE-20 11/30/2022   Pneumococcal Polysaccharide-23 07/22/2017   Tdap 12/17/2016   Unspecified SARS-COV-2 Vaccination 06/13/2022   Zoster Recombinant(Shingrix) 07/16/2017, 11/30/2017    TDAP status: Up to date  Flu Vaccine status: Due, Education has been  provided regarding the importance of this vaccine. Advised may receive this vaccine at local pharmacy or Health Dept. Aware to provide a copy of the vaccination record if obtained from local pharmacy or Health Dept. Verbalized acceptance and understanding.  Pneumococcal vaccine status: Up to date  Covid-19 vaccine status: Information provided on how to obtain vaccines.   Qualifies for Shingles Vaccine? No   Zostavax completed Yes   Shingrix Completed?: Yes  Screening Tests Health Maintenance  Topic Date Due   INFLUENZA VACCINE  03/23/2024   Hepatitis C Screening  08/25/2024 (Originally 01/14/1974)   Medicare Annual Wellness (AWV)  03/29/2025   MAMMOGRAM  04/18/2025   Colonoscopy  08/23/2025   DTaP/Tdap/Td (2 - Td or Tdap) 12/18/2026   Pneumococcal Vaccine: 50+ Years  Completed   DEXA SCAN  Completed   Zoster Vaccines- Shingrix  Completed   Hepatitis B Vaccines  Aged Out   HPV VACCINES  Aged Out   Meningococcal B Vaccine  Aged Out   COVID-19 Vaccine  Discontinued    Health Maintenance  Health Maintenance Due  Topic Date Due   INFLUENZA VACCINE  03/23/2024    Colonoscopy  completed per patient  Mammogram status: Completed =. Repeat every year  Bone Density status: Completed 2023. Results reflect: Bone density results: OSTEOPENIA. Repeat every 3 years.  Lung Cancer Screening: (Low Dose CT Chest recommended if Age 11-80 years, 20 pack-year currently smoking OR have quit w/in 15years.) does not qualify.   Lung Cancer Screening Referral:   Additional Screening:  Hepatitis C Screening:   Vision Screening: Recommended annual ophthalmology exams for early detection of glaucoma and other disorders of the eye. Is the patient up to date with their annual eye exam?  Yes  Who is the provider or what is the name of the office in which the patient attends annual eye exams? [facility] If pt is not established with a provider, would they like to be referred to a provider to establish  care? No .   Dental Screening: Recommended annual dental exams for proper oral hygiene    Community Resource Referral / Chronic Care Management: CRR required this visit?  No   CCM required this visit?  No     Plan:     I have personally reviewed and noted the following in the patient's chart:   Medical and social history Use of alcohol, tobacco or illicit drugs  Current medications and supplements including opioid prescriptions. Patient is not currently taking opioid prescriptions. Functional ability and status Nutritional status Physical activity Advanced directives List of other physicians Hospitalizations, surgeries, and ER visits in previous 12 months Vitals Screenings to include cognitive, depression, and falls Referrals and appointments  In addition, I have reviewed and discussed with patient certain preventive protocols, quality metrics, and best practice recommendations. A written  personalized care plan for preventive services as well as general preventive health recommendations were provided to patient.     Mliss Graff, LPN   08/24/7972   After Visit Summary: (MyChart) Due to this being a telephonic visit, the after visit summary with patients personalized plan was offered to patient via MyChart   Nurse Notes:

## 2024-04-12 ENCOUNTER — Ambulatory Visit: Payer: Medicare Other

## 2024-04-17 DIAGNOSIS — G4733 Obstructive sleep apnea (adult) (pediatric): Secondary | ICD-10-CM | POA: Diagnosis not present

## 2024-05-08 DIAGNOSIS — L570 Actinic keratosis: Secondary | ICD-10-CM | POA: Diagnosis not present

## 2024-05-08 DIAGNOSIS — C44519 Basal cell carcinoma of skin of other part of trunk: Secondary | ICD-10-CM | POA: Diagnosis not present

## 2024-05-08 DIAGNOSIS — Z85828 Personal history of other malignant neoplasm of skin: Secondary | ICD-10-CM | POA: Diagnosis not present

## 2024-05-08 DIAGNOSIS — Z85820 Personal history of malignant melanoma of skin: Secondary | ICD-10-CM | POA: Diagnosis not present

## 2024-05-08 DIAGNOSIS — L821 Other seborrheic keratosis: Secondary | ICD-10-CM | POA: Diagnosis not present

## 2024-05-31 DIAGNOSIS — R5383 Other fatigue: Secondary | ICD-10-CM | POA: Diagnosis not present

## 2024-05-31 DIAGNOSIS — H532 Diplopia: Secondary | ICD-10-CM | POA: Diagnosis not present

## 2024-05-31 DIAGNOSIS — R519 Headache, unspecified: Secondary | ICD-10-CM | POA: Diagnosis not present

## 2024-05-31 DIAGNOSIS — H02402 Unspecified ptosis of left eyelid: Secondary | ICD-10-CM | POA: Diagnosis not present

## 2024-06-17 DIAGNOSIS — G4733 Obstructive sleep apnea (adult) (pediatric): Secondary | ICD-10-CM | POA: Diagnosis not present

## 2024-06-26 DIAGNOSIS — G4733 Obstructive sleep apnea (adult) (pediatric): Secondary | ICD-10-CM | POA: Diagnosis not present

## 2024-07-09 DIAGNOSIS — K080 Exfoliation of teeth due to systemic causes: Secondary | ICD-10-CM | POA: Diagnosis not present

## 2024-07-18 DIAGNOSIS — G4733 Obstructive sleep apnea (adult) (pediatric): Secondary | ICD-10-CM | POA: Diagnosis not present

## 2024-08-29 ENCOUNTER — Telehealth: Payer: Self-pay

## 2024-08-29 NOTE — Telephone Encounter (Signed)
 Copied from CRM [id number]. Topic: Referral - Request for Referral >> Aug 28, 2024  2:33 PM Cheyenne Hanson wrote: Did the patient discuss referral with their provider in the last year? Yes (If No - schedule appointment) (If Yes - send message)  Appointment offered? Yes, pt has appt for today, 08/28/2024.  Type of order/referral and detailed reason for visit: Cheyenne Gaudy MD ophthalmology  Preference of office, provider, location: [phone] [address] [place], [place] [address] ph: [phone] fax: [phone]  If referral order, have you been seen by this specialty before? Yes (If Yes, this issue or another issue? When? Where?1 year ago  Can we respond through MyChart? Yes

## 2024-08-30 ENCOUNTER — Other Ambulatory Visit: Payer: Self-pay

## 2024-08-30 DIAGNOSIS — H4902 Third [oculomotor] nerve palsy, left eye: Secondary | ICD-10-CM

## 2024-12-04 ENCOUNTER — Other Ambulatory Visit

## 2024-12-14 ENCOUNTER — Encounter: Admitting: Family Medicine

## 2025-04-04 ENCOUNTER — Encounter
